# Patient Record
Sex: Female | Born: 1985 | Race: White | Hispanic: No | Marital: Single | Smoking: Current every day smoker
Health system: Southern US, Community
[De-identification: ages and names within clinical notes are randomized; demographics above are authoritative.]

## PROBLEM LIST (undated history)

## (undated) DIAGNOSIS — Z789 Other specified health status: Secondary | ICD-10-CM

## (undated) HISTORY — PX: APPENDECTOMY: SHX54

## (undated) HISTORY — DX: Other specified health status: Z78.9

---

## 2006-06-14 ENCOUNTER — Emergency Department: Payer: Self-pay | Admitting: Emergency Medicine

## 2007-07-15 ENCOUNTER — Emergency Department: Payer: Self-pay | Admitting: Emergency Medicine

## 2007-08-03 ENCOUNTER — Emergency Department: Payer: Self-pay | Admitting: Emergency Medicine

## 2008-01-31 ENCOUNTER — Encounter: Payer: Self-pay | Admitting: Obstetrics & Gynecology

## 2008-02-07 ENCOUNTER — Encounter: Payer: Self-pay | Admitting: Maternal and Fetal Medicine

## 2008-02-14 ENCOUNTER — Encounter: Payer: Self-pay | Admitting: Maternal & Fetal Medicine

## 2008-02-15 ENCOUNTER — Inpatient Hospital Stay: Payer: Self-pay

## 2009-05-20 IMAGING — US US FETAL BPP W/O NON-STRESS - NRPT
1 series · 14 of 18 positions shown · non-contrast
Comparison: none

[Series 1: us fetal bpp w/o non-stress - nrpt · 0.30mm/px · 14 of 18 slices shown]
[im 1/18]
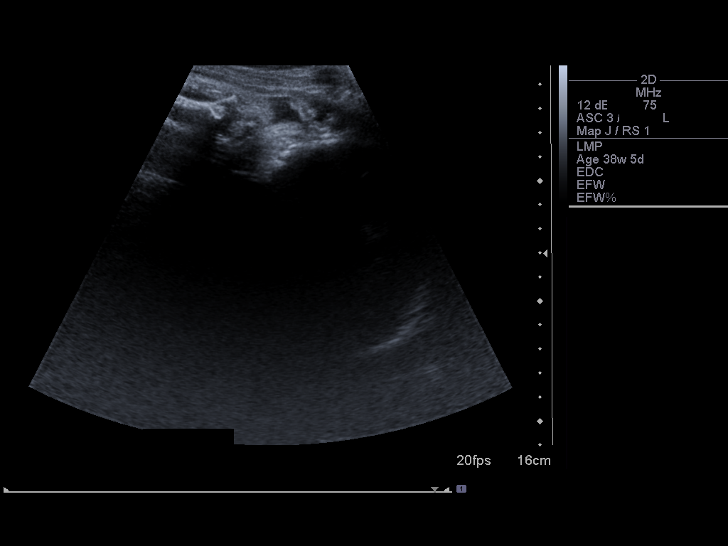
[im 2/18]
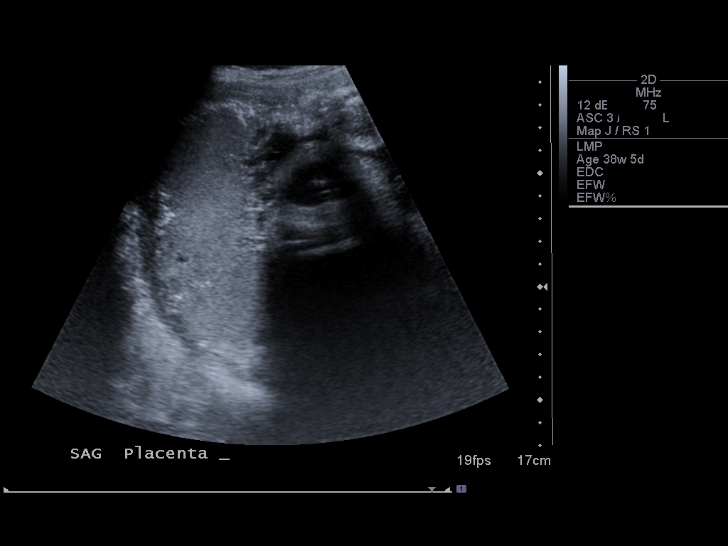
[im 4/18]
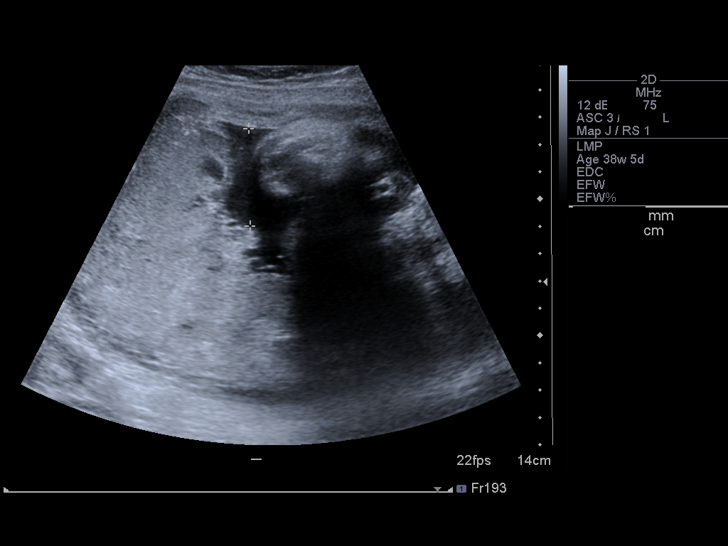
[im 5/18]
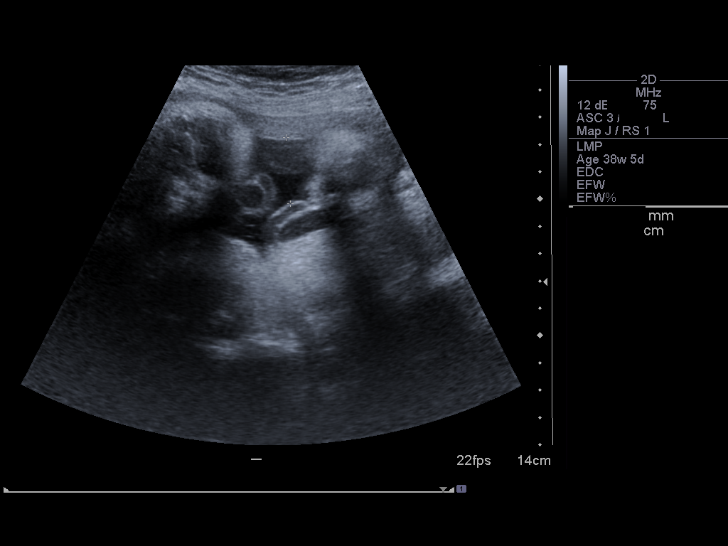
[im 6/18]
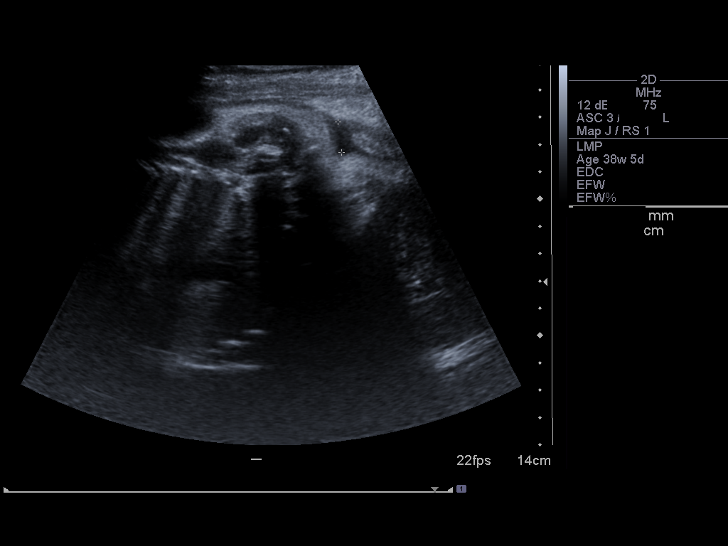
[im 8/18]
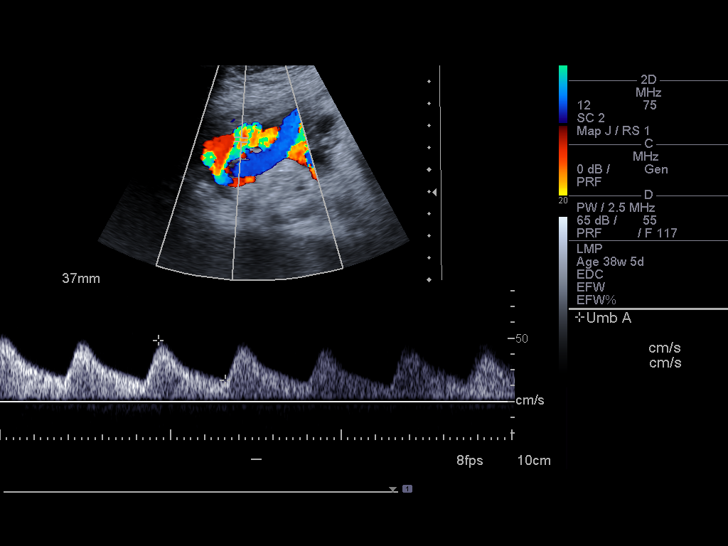
[im 9/18]
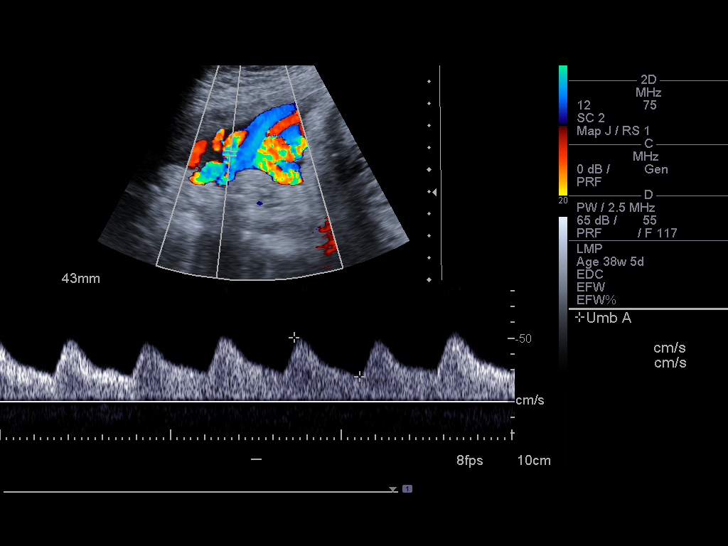
[im 10/18]
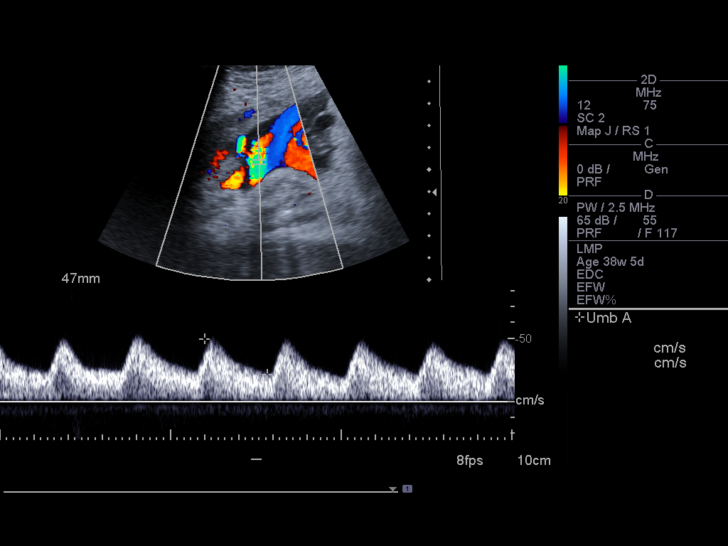
[im 11/18]
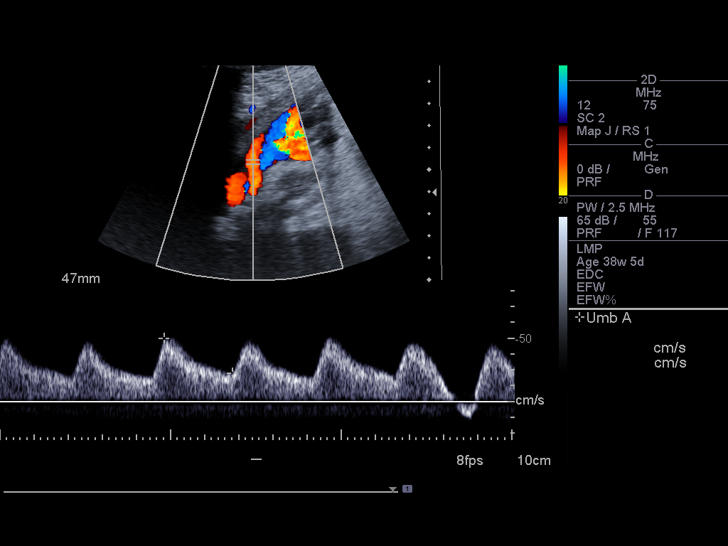
[im 13/18]
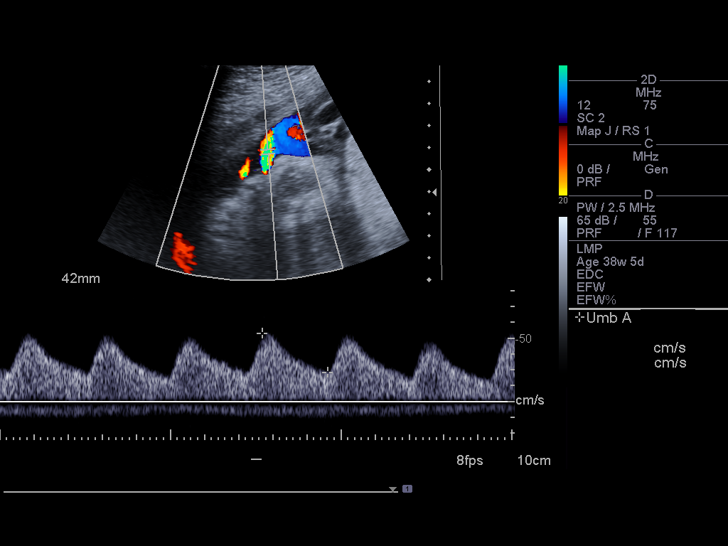
[im 14/18]
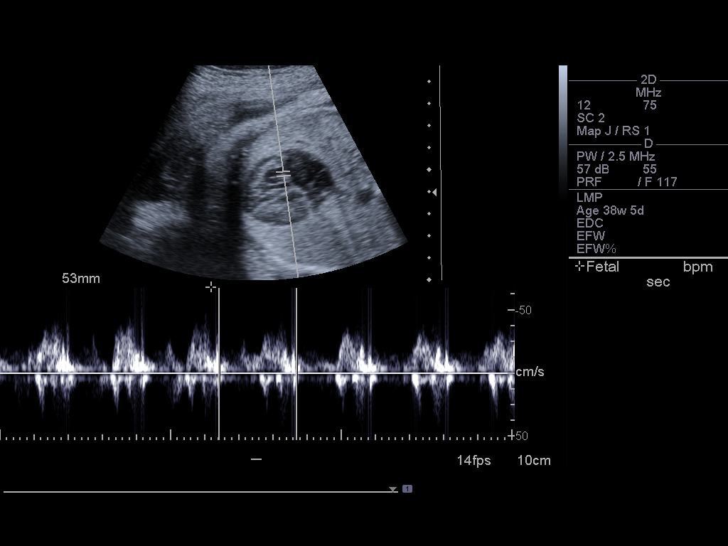
[im 15/18]
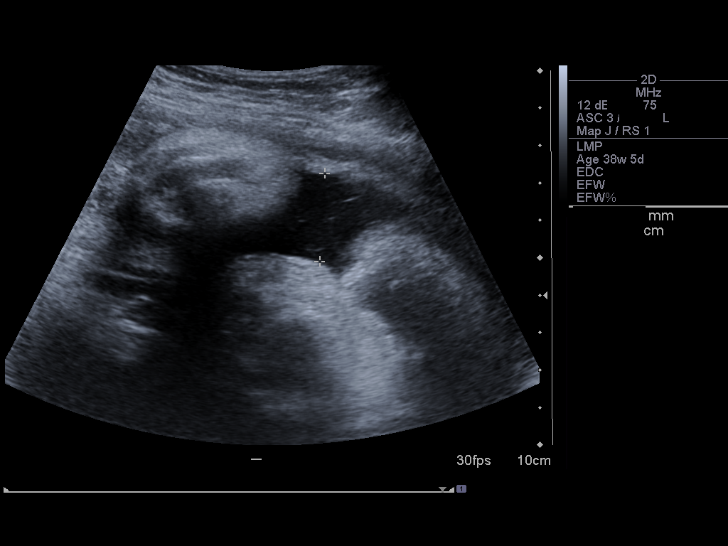
[im 17/18]
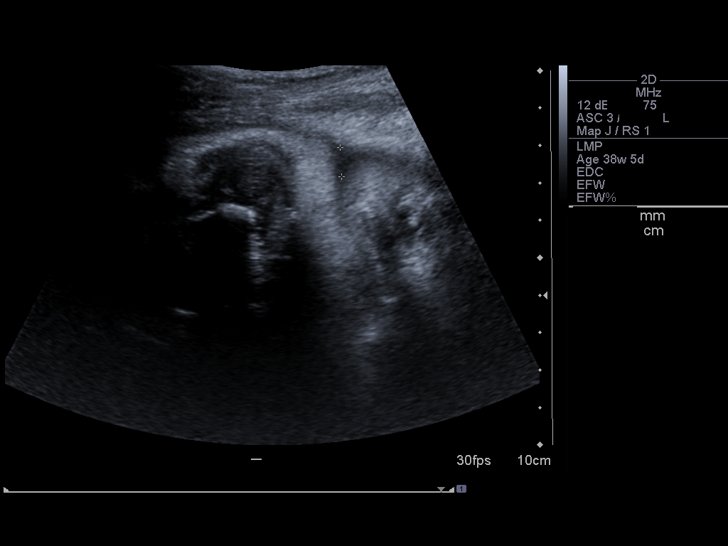
[im 18/18]
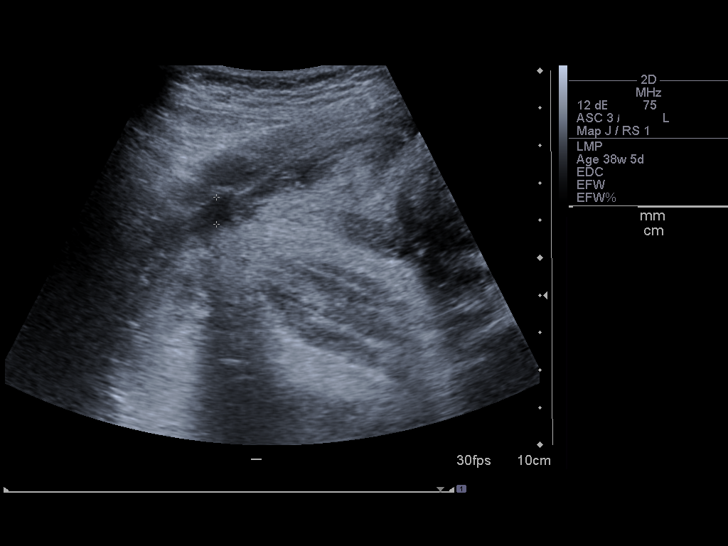

[14 of 18 positions shown; findings below may reference images not displayed]

IMAGES IMPORTED FROM THE SYNGO WORKFLOW SYSTEM
NO DICTATION FOR STUDY

## 2009-11-23 ENCOUNTER — Emergency Department: Payer: Self-pay | Admitting: Emergency Medicine

## 2009-12-31 ENCOUNTER — Ambulatory Visit: Payer: Self-pay | Admitting: Internal Medicine

## 2011-02-07 ENCOUNTER — Ambulatory Visit: Payer: Self-pay | Admitting: Internal Medicine

## 2011-04-01 ENCOUNTER — Ambulatory Visit: Payer: Self-pay
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
## 2015-03-11 ENCOUNTER — Emergency Department: Payer: No Typology Code available for payment source

## 2015-03-11 ENCOUNTER — Emergency Department
Admission: EM | Admit: 2015-03-11 | Discharge: 2015-03-11 | Disposition: A | Discharge status: Home / Self Care | Payer: No Typology Code available for payment source | Attending: Emergency Medicine | Admitting: Emergency Medicine

## 2015-03-11 ENCOUNTER — Encounter: Payer: Self-pay | Admitting: Emergency Medicine

## 2015-03-11 DIAGNOSIS — S4992XA Unspecified injury of left shoulder and upper arm, initial encounter: Secondary | ICD-10-CM | POA: Insufficient documentation

## 2015-03-11 DIAGNOSIS — Y92410 Unspecified street and highway as the place of occurrence of the external cause: Secondary | ICD-10-CM | POA: Diagnosis not present

## 2015-03-11 DIAGNOSIS — Y9389 Activity, other specified: Secondary | ICD-10-CM | POA: Insufficient documentation

## 2015-03-11 DIAGNOSIS — Z720 Tobacco use: Secondary | ICD-10-CM | POA: Diagnosis not present

## 2015-03-11 DIAGNOSIS — S20219A Contusion of unspecified front wall of thorax, initial encounter: Secondary | ICD-10-CM | POA: Diagnosis not present

## 2015-03-11 DIAGNOSIS — S4991XA Unspecified injury of right shoulder and upper arm, initial encounter: Secondary | ICD-10-CM | POA: Diagnosis not present

## 2015-03-11 DIAGNOSIS — Y998 Other external cause status: Secondary | ICD-10-CM | POA: Diagnosis not present

## 2015-03-11 DIAGNOSIS — S29001A Unspecified injury of muscle and tendon of front wall of thorax, initial encounter: Secondary | ICD-10-CM | POA: Diagnosis present

## 2015-03-11 MED ORDER — DIAZEPAM 5 MG PO TABS
5.0000 mg | ORAL_TABLET | Freq: Once | ORAL | Status: AC
  Administered 2015-03-11: 5 mg via ORAL

## 2015-03-11 MED ORDER — IBUPROFEN 600 MG PO TABS: 600.0000 mg | ORAL_TABLET | Freq: Three times a day (TID) | ORAL | Status: DC | PRN

## 2015-03-11 MED ORDER — DIAZEPAM 5 MG PO TABS
ORAL_TABLET | ORAL | Status: AC
  Administered 2015-03-11: 5 mg via ORAL
  Filled 2015-03-11: qty 1

## 2015-03-11 MED ORDER — DIAZEPAM 5 MG PO TABS: 5.0000 mg | ORAL_TABLET | Freq: Three times a day (TID) | ORAL | Status: DC | PRN
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
## 2015-10-09 LAB — HM PAP SMEAR: HM Pap smear: POSITIVE

## 2016-01-06 DIAGNOSIS — N879 Dysplasia of cervix uteri, unspecified: Secondary | ICD-10-CM | POA: Insufficient documentation

## 2016-05-14 ENCOUNTER — Emergency Department: Payer: Self-pay

## 2016-05-14 ENCOUNTER — Emergency Department
Admission: EM | Admit: 2016-05-14 | Discharge: 2016-05-14 | Disposition: A | Discharge status: Home / Self Care | Payer: Self-pay | Attending: Emergency Medicine | Admitting: Emergency Medicine

## 2016-05-14 ENCOUNTER — Encounter: Payer: Self-pay | Admitting: Emergency Medicine

## 2016-05-14 DIAGNOSIS — N939 Abnormal uterine and vaginal bleeding, unspecified: Secondary | ICD-10-CM

## 2016-05-14 DIAGNOSIS — F17210 Nicotine dependence, cigarettes, uncomplicated: Secondary | ICD-10-CM | POA: Insufficient documentation

## 2016-05-14 LAB — CBC WITH DIFFERENTIAL/PLATELET
BASOS ABS: 0 10*3/uL (ref 0–0.1)
BASOS PCT: 1 %
BASOS PCT: 0 %
Basophils Absolute: 0 10*3/uL (ref 0–0.1)
EOS ABS: 0.1 10*3/uL (ref 0–0.7)
Eosinophils Absolute: 0 10*3/uL (ref 0–0.7)
Eosinophils Relative: 1 %
Eosinophils Relative: 0 %
HCT: 41.6 % (ref 35.0–47.0)
HEMATOCRIT: 35.5 % (ref 35.0–47.0)
HEMOGLOBIN: 14.6 g/dL (ref 12.0–16.0)
HEMOGLOBIN: 12.4 g/dL (ref 12.0–16.0)
Lymphocytes Relative: 38 %
Lymphocytes Relative: 18 %
Lymphs Abs: 2.3 10*3/uL (ref 1.0–3.6)
Lymphs Abs: 1.4 10*3/uL (ref 1.0–3.6)
MCH: 32.5 pg (ref 26.0–34.0)
MCH: 33 pg (ref 26.0–34.0)
MCHC: 35.1 g/dL (ref 32.0–36.0)
MCHC: 35.1 g/dL (ref 32.0–36.0)
MCV: 92.7 fL (ref 80.0–100.0)
MCV: 94 fL (ref 80.0–100.0)
MONOS PCT: 5 %
Monocytes Absolute: 0.3 10*3/uL (ref 0.2–0.9)
Monocytes Absolute: 0.4 10*3/uL (ref 0.2–0.9)
Monocytes Relative: 6 %
NEUTROS ABS: 5.7 10*3/uL (ref 1.4–6.5)
NEUTROS PCT: 55 %
NEUTROS PCT: 76 %
Neutro Abs: 3.3 10*3/uL (ref 1.4–6.5)
Platelets: 219 10*3/uL (ref 150–440)
Platelets: 168 10*3/uL (ref 150–440)
RBC: 4.49 MIL/uL (ref 3.80–5.20)
RBC: 3.77 MIL/uL — ABNORMAL LOW (ref 3.80–5.20)
RDW: 13.1 % (ref 11.5–14.5)
RDW: 12.9 % (ref 11.5–14.5)
WBC: 6 10*3/uL (ref 3.6–11.0)
WBC: 7.5 10*3/uL (ref 3.6–11.0)

## 2016-05-14 LAB — COMPREHENSIVE METABOLIC PANEL
ALBUMIN: 4.7 g/dL (ref 3.5–5.0)
ALK PHOS: 58 U/L (ref 38–126)
ALT: 38 U/L (ref 14–54)
AST: 41 U/L (ref 15–41)
Anion gap: 13 (ref 5–15)
BUN: 8 mg/dL (ref 6–20)
CO2: 21 mmol/L — AB (ref 22–32)
Calcium: 9.5 mg/dL (ref 8.9–10.3)
Chloride: 105 mmol/L (ref 101–111)
Creatinine, Ser: 0.77 mg/dL (ref 0.44–1.00)
GFR calc Af Amer: >60 mL/min (ref >60)
GFR calc non Af Amer: >60 mL/min (ref >60)
GLUCOSE: 106 mg/dL — AB (ref 65–99)
POTASSIUM: 3.8 mmol/L (ref 3.5–5.1)
SODIUM: 139 mmol/L (ref 135–145)
Total Bilirubin: 1.4 mg/dL — ABNORMAL HIGH (ref 0.3–1.2)
Total Protein: 8.1 g/dL (ref 6.5–8.1)

## 2016-05-14 LAB — TYPE AND SCREEN
ABO/RH(D): O POS
Antibody Screen: NEGATIVE

## 2016-05-14 LAB — POCT PREGNANCY, URINE: PREG TEST UR: NEGATIVE

## 2016-05-14 LAB — CHLAMYDIA/NGC RT PCR (ARMC ONLY)

## 2016-05-14 MED ORDER — LORAZEPAM 2 MG/ML IJ SOLN
1.0000 mg | Freq: Once | INTRAMUSCULAR | Status: AC
  Administered 2016-05-14: 1 mg via INTRAVENOUS
  Filled 2016-05-14: qty 1

## 2016-05-14 MED ORDER — OXYCODONE-ACETAMINOPHEN 5-325 MG PO TABS: 2.0000 | ORAL_TABLET | Freq: Four times a day (QID) | ORAL | 0 refills | Status: DC | PRN

## 2016-05-14 MED ORDER — OXYCODONE-ACETAMINOPHEN 5-325 MG PO TABS
2.0000 | ORAL_TABLET | Freq: Once | ORAL | Status: AC
Start: 2016-05-14 — End: 2016-05-14
  Administered 2016-05-14: 2 via ORAL
  Filled 2016-05-14: qty 2

## 2016-05-14 MED ORDER — SODIUM CHLORIDE 0.9 % IV SOLN
Freq: Once | INTRAVENOUS | Status: AC
  Administered 2016-05-14: 11:00:00 via INTRAVENOUS

## 2016-05-14 NOTE — ED Notes (Signed)
Lab called back, unable to run gonorrhea and chlamiydia specimen due to the sample having too much blood

## 2016-05-14 NOTE — ED Notes (Signed)
Pt ambulatory in room. No bleeding at this time. Pt reports feeling a little dizzy per pt. steady gate. Jimmye Norman, MD informed.

## 2016-05-14 NOTE — ED Provider Notes (Signed)
[facility] Emergency Department Provider Note        Time seen: ----------------------------------------- 10:33 AM on 05/14/2016 -----------------------------------------    I have reviewed the triage vital signs and the nursing notes.   HISTORY  Chief Complaint Vaginal Bleeding    HPI Haley Thomas is a 30 y.o. female who presents to the ER for vaginal hemorrhage that started after having sex this morning. Patient states she's never had heavy bleeding before, nothing makes it better or worse. Patient placed a tampon to try to improve the bleeding. She denies anticoagulants, does not feel like there was an injury during intercourse.   History reviewed. No pertinent past medical history.  There are no active problems to display for this patient.   History reviewed. No pertinent surgical history.  Allergies Patient has no known allergies.  Social History Social History  Substance Use Topics  . Smoking status: Current Every Day Smoker    Packs/day: 0.50    Types: Cigarettes  . Smokeless tobacco: Never Used  . Alcohol use Yes     Comment: occassional     Review of Systems Constitutional: Negative for fever. Cardiovascular: Negative for chest pain. Respiratory: Negative for shortness of breath. Gastrointestinal: Negative for abdominal pain, vomiting and diarrhea. Genitourinary: Positive for heavy vaginal bleeding Musculoskeletal: Negative for back pain. Skin: Negative for rash. Neurological: Negative for headaches, focal weakness or numbness.  10-point ROS otherwise negative.  ____________________________________________   PHYSICAL EXAM:  VITAL SIGNS: ED Triage Vitals  Enc Vitals Group     BP      Pulse      Resp      Temp      Temp src      SpO2      Weight      Height      Head Circumference      Peak Flow      Pain Score      Pain Loc      Pain Edu?      Excl. in [place]?     Constitutional: Alert and oriented.  Anxious, mild distress Eyes: Conjunctivae are normal. PERRL. Normal extraocular movements. ENT   Head: Normocephalic and atraumatic.   Nose: No congestion/rhinnorhea.   Mouth/Throat: Mucous membranes are moist.   Neck: No stridor. Cardiovascular: Normal rate, regular rhythm. No murmurs, rubs, or gallops. Respiratory: Normal respiratory effort without tachypnea nor retractions. Breath sounds are clear and equal bilaterally. No wheezes/rales/rhonchi. Gastrointestinal: Soft and nontender. Normal bowel sounds Genitourinary: Bleeding seems under control at this time Musculoskeletal: Nontender with normal range of motion in all extremities. No lower extremity tenderness nor edema. Neurologic:  Normal speech and language. No gross focal neurologic deficits are appreciated.  Skin:  Skin is warm, dry and intact. No rash noted. Psychiatric: Anxious mood and affect ____________________________________________  ED COURSE:  Pertinent labs & imaging results that were available during my care of the patient were reviewed by me and considered in my medical decision making (see chart for details). Clinical Course   Patient is in mild distress, we will check basic labs and reevaluate.  Procedures ____________________________________________   LABS (pertinent positives/negatives)  Labs Reviewed  COMPREHENSIVE METABOLIC PANEL - Abnormal; Notable for the following:       Result Value   CO2 21 (*)    Glucose, Bld 106 (*)    Total Bilirubin 1.4 (*)    All other components within normal limits  CBC WITH DIFFERENTIAL/PLATELET - Abnormal; Notable for  the following:    RBC 3.77 (*)    All other components within normal limits  CHLAMYDIA/NGC RT PCR ([facility] ONLY)  CBC WITH DIFFERENTIAL/PLATELET  POC URINE PREG, ED  POCT PREGNANCY, URINE  TYPE AND SCREEN    RADIOLOGY Pelvic ultrasound  IMPRESSION: No acute findings or explanation for the patient's symptoms. No endometrial  thickening.  ____________________________________________  FINAL ASSESSMENT AND PLAN  Abnormal Vaginal bleeding  Plan: Patient with labs and imaging as dictated above. Patient is in no distress, she has stood up and the bleeding seems resolved at this time. Her vital signs are stable, I discussed with Dr. Kenton Kingfisher who will see the patient as an outpatient in the office. She is agreeable to plan at this time.   Earleen Newport, MD   Note: This dictation was prepared with Dragon dictation. Any transcriptional errors that result from this process are unintentional    Earleen Newport, MD 05/14/16 1452

## 2016-05-14 NOTE — ED Notes (Signed)
Lab called to add on Chlamydia to urine in lab.
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
## 2016-05-16 ENCOUNTER — Telehealth: Payer: Self-pay | Admitting: Emergency Medicine

## 2016-05-16 NOTE — Telephone Encounter (Addendum)
Called patient to notify that gc/chlamydia test will not be done due to specimen rejection.  Left message asking her to call me.  Also notified sara at [facility] as patient was referred to dr Kenton Kingfisher   Patient called me back and says she is going to [facility] for follow up.  I explained that gc/chlamydia test was not done, and she will inform her doctor.

## 2016-05-23 DIAGNOSIS — F17200 Nicotine dependence, unspecified, uncomplicated: Secondary | ICD-10-CM | POA: Insufficient documentation

## 2017-09-12 LAB — HM HIV SCREENING LAB: HM HIV Screening: NEGATIVE

## 2018-05-21 IMAGING — US US TRANSVAGINAL NON-OB
1 series · 13 of 25 positions shown · non-contrast
Comparison: Obstetric ultrasound 02/14/2008 -no report

CLINICAL DATA: Pelvic pain and vaginal bleeding for 3 hours after
intercourse. LMP 05/12/2016.

EXAM:
TRANSABDOMINAL AND TRANSVAGINAL ULTRASOUND OF PELVIS
TECHNIQUE: Both transabdominal and transvaginal ultrasound examinations of the
pelvis were performed. Transabdominal technique was performed for
global imaging of the pelvis including uterus, ovaries, adnexal
regions, and pelvic cul-de-sac. It was necessary to proceed with
endovaginal exam following the transabdominal exam to visualize the
endometrium and right ovary to better advantage.

[Series 1: us transvaginal non-ob · 0.18mm/px · 13 of 88 slices shown]
[im 1/88]
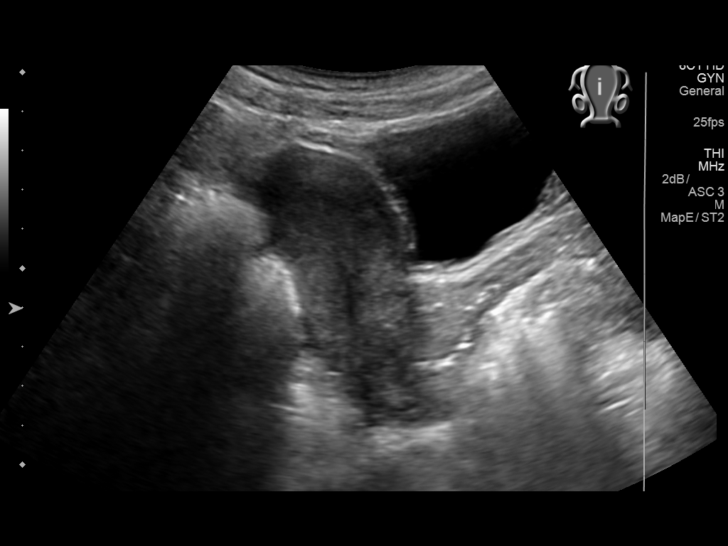
[im 8/88]
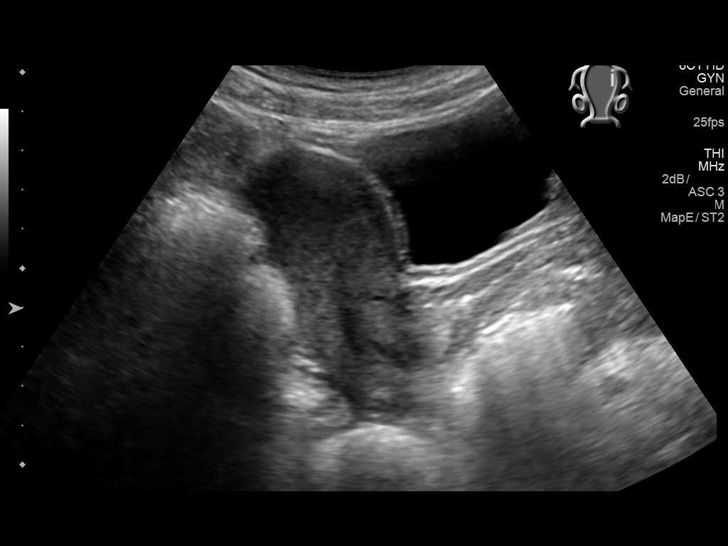
[im 15/88]
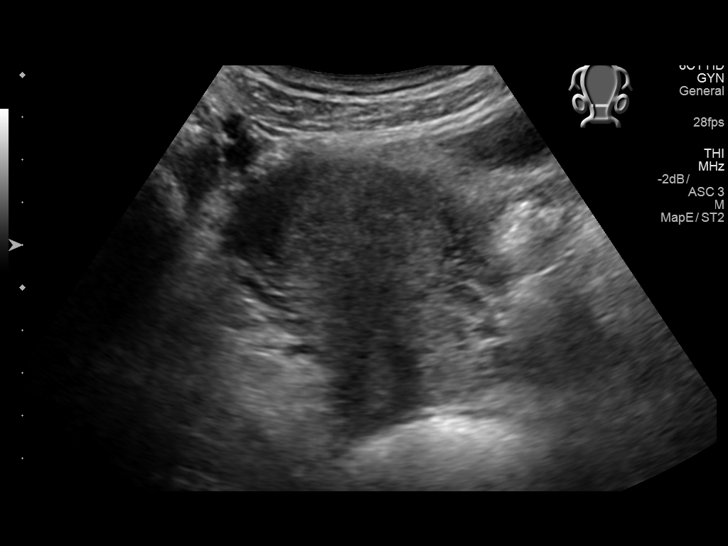
[im 22/88]
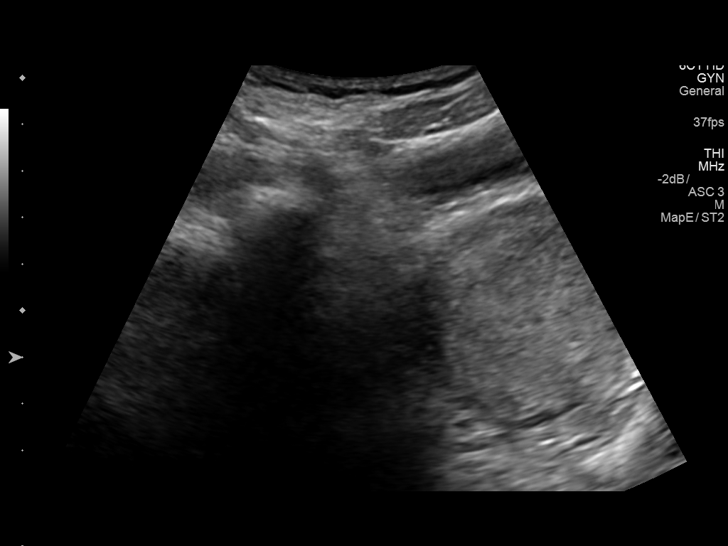
[im 30/88]
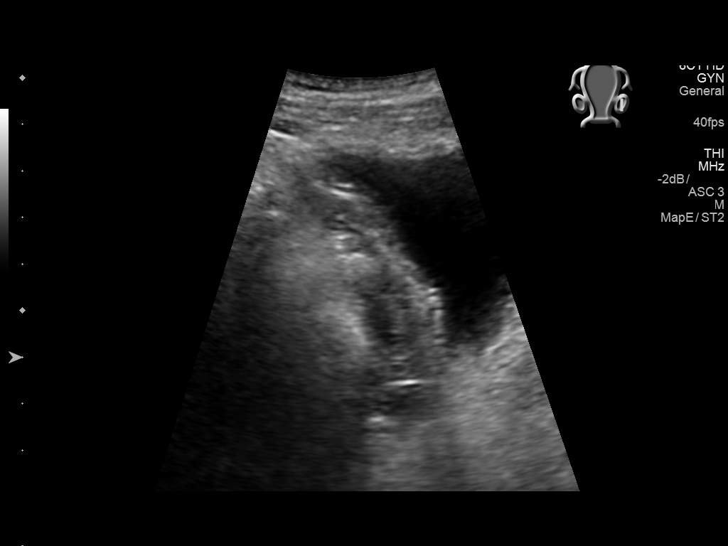
[im 37/88]
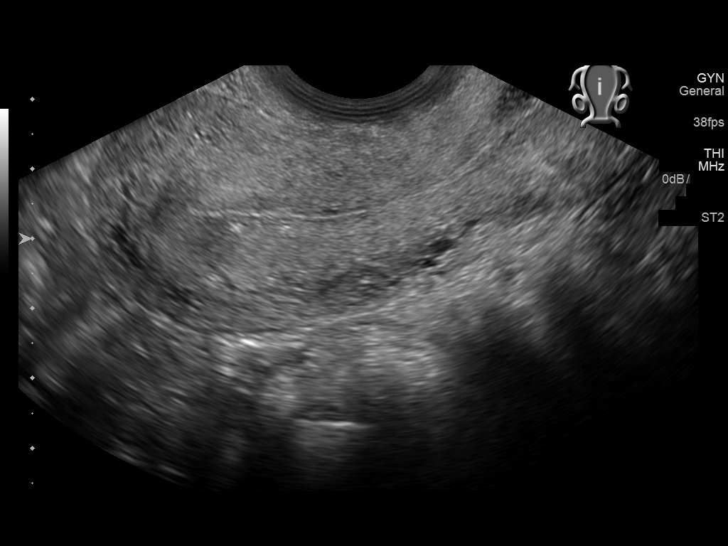
[im 44/88]
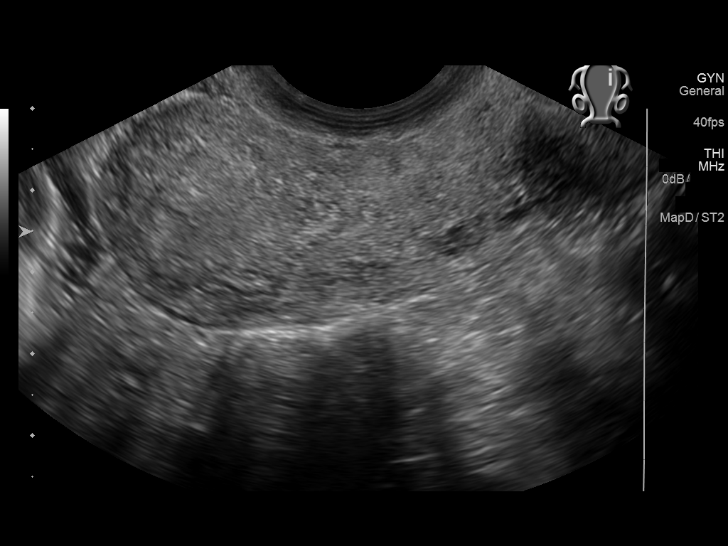
[im 51/88]
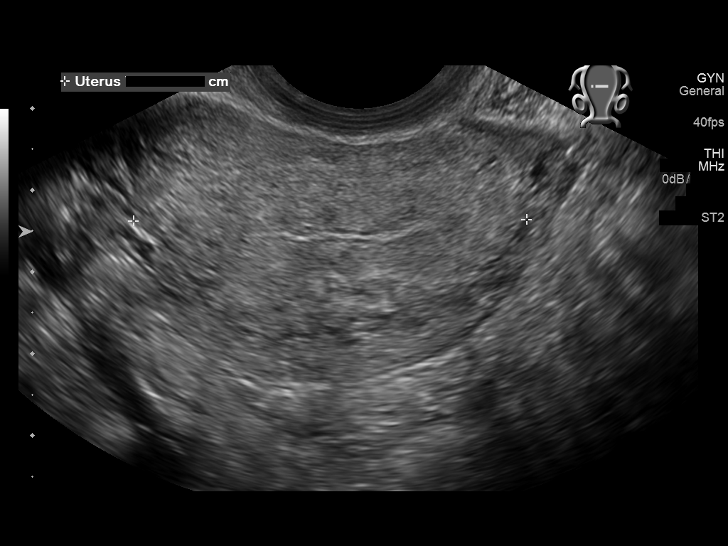
[im 59/88]
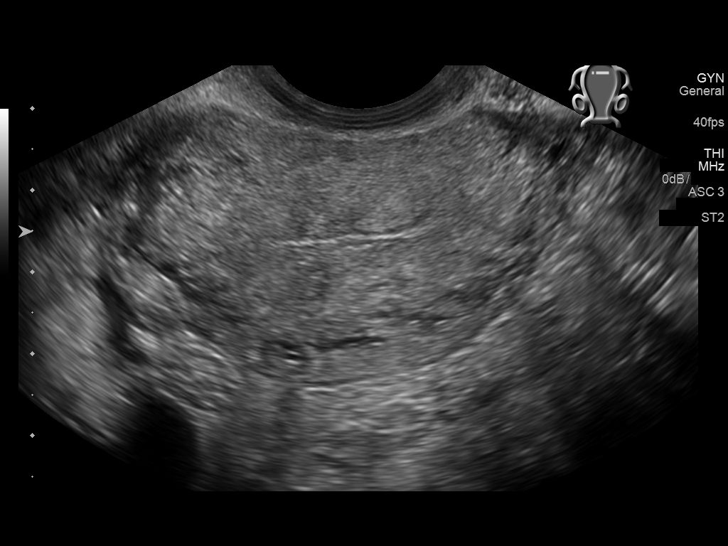
[im 66/88]
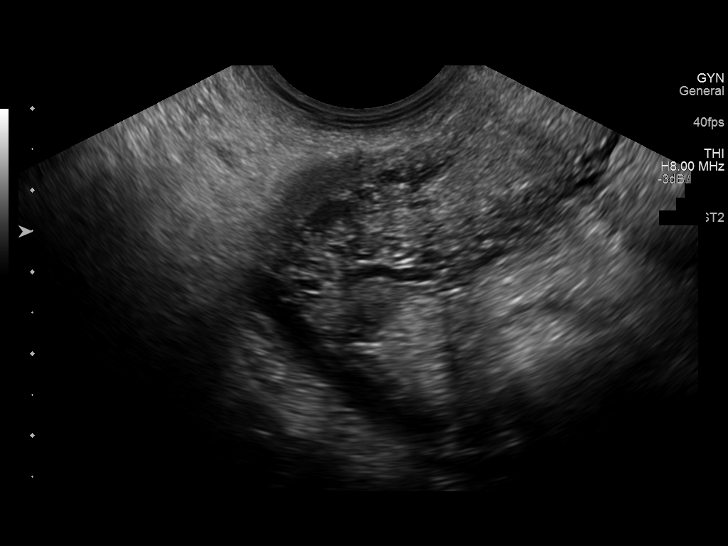
[im 73/88]
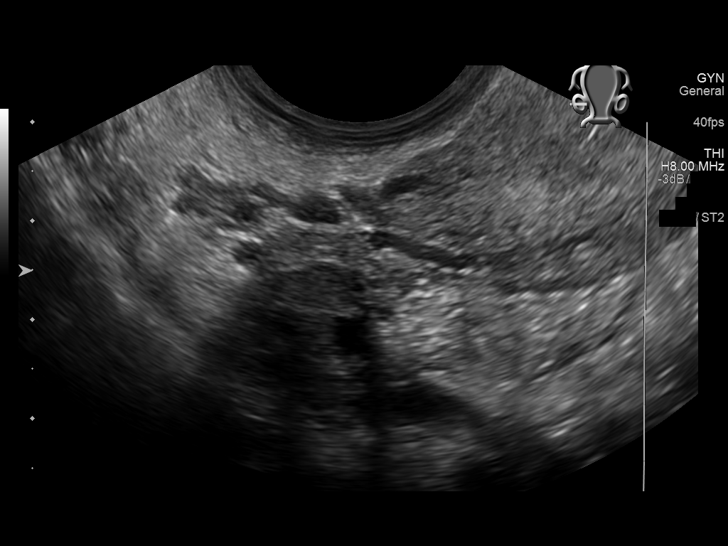
[im 80/88]
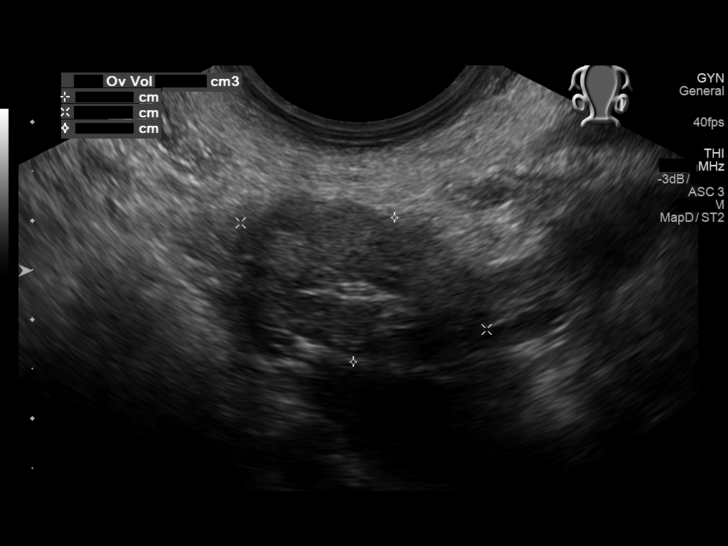
[im 88/88]
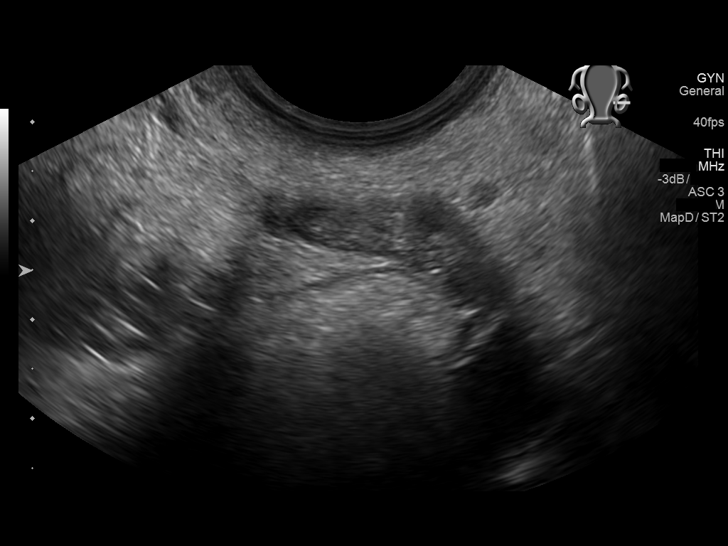

[13 of 25 positions shown; findings below may reference images not displayed]

FINDINGS: Uterus

Measurements: 7.2 x 3.4 x 4.8 cm. No fibroids or other mass
visualized.

Endometrium

Thickness: 1.4 mm. No focal abnormality identified. Possible small
sub endometrial calcifications.

Right ovary

Measurements: 2.4 x 1.4 x 1.3 cm. Normal appearance/no adnexal mass.
Normal blood flow with color Doppler.

Left ovary

Measurements: 2.7 x 1.5 x 1.9 cm. Normal appearance/no adnexal mass.
Normal blood flow with color Doppler.

Other findings

Trace free pelvic fluid.
IMPRESSION: No acute findings or explanation for the patient's symptoms. No
endometrial thickening.

## 2019-05-14 ENCOUNTER — Other Ambulatory Visit: Payer: Self-pay | Admitting: General Practice

## 2019-05-14 DIAGNOSIS — Z20822 Contact with and (suspected) exposure to covid-19: Secondary | ICD-10-CM

## 2019-05-16 LAB — NOVEL CORONAVIRUS, NAA: SARS-CoV-2, NAA: NOT DETECTED

## 2019-06-13 ENCOUNTER — Telehealth: Payer: Self-pay | Admitting: Family Medicine

## 2019-06-13 NOTE — Telephone Encounter (Signed)
Past PE 09/12/2017.   TC to patient re: BC refill.  Informed patient needs PE.Marland Kitchen Scheduled for 06/18/2019 Aileen Fass, RN

## 2019-06-13 NOTE — Telephone Encounter (Signed)
Patient wants her birth control refill prescription sent to her local pharmacy.

## 2019-06-18 ENCOUNTER — Ambulatory Visit: Payer: Self-pay

## 2019-06-25 ENCOUNTER — Other Ambulatory Visit: Payer: Self-pay

## 2019-06-25 ENCOUNTER — Other Ambulatory Visit: Payer: Self-pay | Admitting: Family Medicine

## 2019-06-25 ENCOUNTER — Ambulatory Visit (LOCAL_COMMUNITY_HEALTH_CENTER): Payer: 59 | Admitting: Family Medicine

## 2019-06-25 VITALS — BP 108/68 | Ht 66.5 in | Wt 107.0 lb

## 2019-06-25 DIAGNOSIS — Z30011 Encounter for initial prescription of contraceptive pills: Secondary | ICD-10-CM

## 2019-06-25 DIAGNOSIS — Z3009 Encounter for other general counseling and advice on contraception: Secondary | ICD-10-CM | POA: Diagnosis not present

## 2019-06-25 DIAGNOSIS — Z113 Encounter for screening for infections with a predominantly sexual mode of transmission: Secondary | ICD-10-CM | POA: Diagnosis not present

## 2019-06-25 MED ORDER — NORETHIN ACE-ETH ESTRAD-FE 1-20 MG-MCG PO TABS: 1.0000 | ORAL_TABLET | Freq: Every day | ORAL | 12 refills | Status: DC

## 2019-06-25 NOTE — Progress Notes (Signed)
Family Planning Visit- Repeat Yearly Visit  Subjective:  Haley Thomas is a 34 y.o. being seen today for an well woman visit and to discuss family planning options.    She is currently using condoms  for pregnancy prevention. Patient reports she does not if she or her partner wants a pregnancy in the next year. 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  Chief Complaint  Patient presents with  . Annual Exam    physical  . Contraception    desires OCP Rx sent to pharmacy    Patient reports h/o abnormal paps, last pap in 2019 was normal per client.  Client states that she wants to quit smoking.  She smokes 1/2ppd.  She tried the patches in the past.  Will decide at a later time when to quit.  Patient denies concerns or problems.  Does the patient desire a pregnancy in the next year? (OKQ flowsheet)no  See flowsheet for other program required questions.   Body mass index is 17.01 kg/m. - Patient is eligible for diabetes screening based on BMI and age [id number]?  not applicable [id number] ordered? not applicable  Patient reports 2 of partners in last year. Desires STI screening?  Yes  Does the patient have a current or past history of drug use? No    No components found for: HCV]   Health Maintenance Due  Topic Date Due  . TETANUS/TDAP  10/24/2005  . PAP SMEAR-Modifier  10/08/2016  . INFLUENZA VACCINE  01/12/2019    ROS  The following portions of the patient's history were reviewed and updated as appropriate: allergies, current medications, past family history, past medical history, past social history, past surgical history and problem list. Problem list updated.  Objective:   Vitals:   06/25/19 1611  BP: 108/68  Weight: 107 lb (48.5 kg)  Height: 5' 6.5" (1.689 m)    Physical Exam    Assessment and Plan:  Haley Thomas is a 34 y.o. female presenting to the [facility] Department for an initial well woman exam/family planning  visit  Contraception counseling: Reviewed all forms of birth control options in the tiered based approach. available including abstinence; over the counter/barrier methods; hormonal contraceptive medication including pill, patch, ring, injection,contraceptive implant; hormonal and nonhormonal IUDs; permanent sterilization options including vasectomy and the various tubal sterilization modalities. Risks, benefits, and typical effectiveness rates were reviewed.  Questions were answered.  She was told to call with any further questions, or with any concerns about this method of contraception.  Emphasized use of condoms 100% of the time for STI prevention.  Patient declines ECP. States no unprotected sex within the past 5 days. ECP  ECP counseling was not given - see RN documentation   Patient desires Lenda Kelp, this was prescribed for patient. She will follow up in  1 year  for surveillance.  Patient was counseled on the side effect of the method chosen, the correct use of her desired method and how to discontinue in the future. She was told to call with any further questions, or with any concerns about this method of contraception.  Emphasized use of condoms 100% of the time for STI prevention.  1. General counseling and advice on contraceptive management 5 As reviewed, though client states not ready at this time to quit smoking  2. OCP (oral contraceptive pills) initiation  - norethindrone-ethinyl estradiol (JUNEL FE 1/20) 1-20 MG-MCG tablet; Take 1 tablet by mouth daily.  Dispense: 1 Package; Refill: 12  Condoms  For back up x 1 week , always for STD prevention. 3. Screening examination for venereal disease  - Chlamydia/Gonorrhea [facility]     No follow-ups on file.  No future appointments.  Hassell Done, FNP

## 2019-06-25 NOTE — Progress Notes (Signed)
Pt desires to restart Junel Fe, did not have any problems with it.

## 2019-07-03 LAB — IGP, APTIMA HPV
HPV Aptima: NEGATIVE
PAP Smear Comment: 0

## 2020-06-10 ENCOUNTER — Other Ambulatory Visit: Payer: Self-pay | Admitting: Family Medicine

## 2020-06-10 DIAGNOSIS — Z30011 Encounter for initial prescription of contraceptive pills: Secondary | ICD-10-CM

## 2020-06-11 NOTE — Telephone Encounter (Signed)
Per chart review, patient had PE 06/2019 and refills sent to pharmacy for 3 month supply with refills for 1 year.  Will OK one refill and note to pharmacy that patient needs in-person visit prior to further refills.

## 2020-06-23 ENCOUNTER — Telehealth: Payer: Self-pay

## 2020-06-23 NOTE — Telephone Encounter (Signed)
Telephone call to patient today regarding the need for a physical and repeat PAP due 06/2020.  Patient is transferring her care to her new PCP.   Close to PAP f/u. Dahlia Bailiff, RN

## 2022-02-01 NOTE — Progress Notes (Unsigned)
   There were no vitals taken for this visit.   Subjective:    Patient ID: Haley Thomas, female    DOB: 1986-04-04, 36 y.o.   MRN: [id number]  HPI: Haley Thomas is a 36 y.o. female  No chief complaint on file.  Patient presents to clinic to establish care with new PCP.  Introduced to Designer, jewellery role and practice setting.  All questions answered.  Discussed provider/patient relationship and expectations.  Patient reports a history of ***. Patient denies a history of: Hypertension, Elevated Cholesterol, Diabetes, Thyroid problems, Depression, Anxiety, Neurological problems, and Abdominal problems.   Active Ambulatory Problems    Diagnosis Date Noted   Cervical dysplasia 01/06/2016   Resolved Ambulatory Problems    Diagnosis Date Noted   No Resolved Ambulatory Problems   Past Medical History:  Diagnosis Date   Patient denies medical problems    Past Surgical History:  Procedure Laterality Date   APPENDECTOMY      Family History  Problem Relation Age of Onset   Heart attack Mother    Hypertension Mother    Lung disease Mother    COPD Mother    Hypertension Father      Review of Systems  Per HPI unless specifically indicated above     Objective:    There were no vitals taken for this visit.  Wt Readings from Last 3 Encounters:  06/25/19 107 lb (48.5 kg)  09/12/17 110 lb (49.9 kg)  05/14/16 110 lb (49.9 kg)    Physical Exam  Results for orders placed or performed in visit on 06/25/19  IGP, Aptima HPV  Result Value Ref Range   DIAGNOSIS: Comment    Specimen adequacy: Comment    Clinician Provided ICD10 Comment    Performed by: Comment    PAP Smear Comment .    Note: Comment    Test Methodology Comment    HPV Aptima Negative Negative      Assessment & Plan:   Problem List Items Addressed This Visit   None Visit Diagnoses     Encounter to establish care    -  Primary        Follow up plan: No follow-ups on file.

## 2022-02-02 ENCOUNTER — Ambulatory Visit: Payer: 59 | Admitting: Nurse Practitioner

## 2022-02-02 ENCOUNTER — Encounter: Payer: Self-pay | Admitting: Nurse Practitioner

## 2022-02-02 VITALS — BP 104/69 | HR 85 | Temp 98.2°F | Ht 66.14 in | Wt 103.0 lb

## 2022-02-02 DIAGNOSIS — Z1331 Encounter for screening for depression: Secondary | ICD-10-CM | POA: Diagnosis not present

## 2022-02-02 DIAGNOSIS — D229 Melanocytic nevi, unspecified: Secondary | ICD-10-CM | POA: Diagnosis not present

## 2022-02-02 DIAGNOSIS — Z7689 Persons encountering health services in other specified circumstances: Secondary | ICD-10-CM | POA: Diagnosis not present

## 2022-02-02 DIAGNOSIS — F17200 Nicotine dependence, unspecified, uncomplicated: Secondary | ICD-10-CM

## 2022-02-02 DIAGNOSIS — R5383 Other fatigue: Secondary | ICD-10-CM

## 2022-02-02 NOTE — Assessment & Plan Note (Signed)
Current everyday smoker.  Smoking 1ppd.  Declines pneumonia shot.

## 2022-02-02 NOTE — Assessment & Plan Note (Signed)
Has been on Prozac in the past.  Suspect underlying thyroid issue.  Labs ordered today.  Will await labs and treat patient based on lab results.  If no thyroid issues will discuss medications for depression/anxiety at next visit.  Follow up in 1 month for reevaluation.  Call sooner if concerns arise.

## 2022-02-03 LAB — THYROID PEROXIDASE ANTIBODY: Thyroperoxidase Ab SerPl-aCnc: <9 IU/mL (ref 0–34)

## 2022-02-03 LAB — THYROID PANEL WITH TSH
Free Thyroxine Index: 1.2 (ref 1.2–4.9)
T3 Uptake Ratio: 22 % — ABNORMAL LOW (ref 24–39)
T4, Total: 5.4 ug/dL (ref 4.5–12.0)
TSH: 3.07 u[IU]/mL (ref 0.450–4.500)

## 2022-02-03 NOTE — Progress Notes (Signed)
Hi Haley Thomas.  Your thyroid labs are unremarkable.  We will continue to find the cause of your symptoms at your next visit.  Please let me know if you have any questions.

## 2022-03-10 NOTE — Progress Notes (Deleted)
There were no vitals taken for this visit.   Subjective:    Patient ID: Haley Thomas, female    DOB: Feb 07, 1986, 36 y.o.   MRN: [id number]  HPI: Haley Thomas is a 36 y.o. female presenting on 03/14/2022 for comprehensive medical examination. Current medical complaints include:{Blank single:19197::"none","***"}  She currently lives with: Menopausal Symptoms: {Blank single:19197::"yes","no"}  Depression Screen done today and results listed below:     02/02/2022    9:47 AM 06/25/2019    4:22 PM  Depression screen PHQ 2/9  Decreased Interest 0 0  Down, Depressed, Hopeless 1 0  PHQ - 2 Score 1 0  Altered sleeping 3   Tired, decreased energy 3   Change in appetite 2   Feeling bad or failure about yourself  0   Trouble concentrating 0   Moving slowly or fidgety/restless 0   Suicidal thoughts 0   PHQ-9 Score 9   Difficult doing work/chores Not difficult at all     The patient {has/does not have:19849} a history of falls. I {did/did not:19850} complete a risk assessment for falls. A plan of care for falls {was/was not:19852} documented.   Past Medical History:  Past Medical History:  Diagnosis Date  . Patient denies medical problems     Surgical History:  Past Surgical History:  Procedure Laterality Date  . APPENDECTOMY      Medications:  Current Outpatient Medications on File Prior to Visit  Medication Sig  . JUNEL FE 1/20 1-20 MG-MCG tablet Take 1 tablet by mouth daily.   No current facility-administered medications on file prior to visit.    Allergies:  No Known Allergies  Social History:  Social History   Socioeconomic History  . Marital status: Single    Spouse name: Not on file  . Number of children: Not on file  . Years of education: Not on file  . Highest education level: Not on file  Occupational History  . Not on file  Tobacco Use  . Smoking status: Every Day    Packs/day: 0.50    Types: Cigarettes  . Smokeless tobacco: Never  Vaping  Use  . Vaping Use: Never used  Substance and Sexual Activity  . Alcohol use: Yes    Comment: occassional   . Drug use: No  . Sexual activity: Yes    Birth control/protection: Condom  Other Topics Concern  . Not on file  Social History Narrative  . Not on file   Social Determinants of Health   Financial Resource Strain: Not on file  Food Insecurity: Not on file  Transportation Needs: Not on file  Physical Activity: Not on file  Stress: Not on file  Social Connections: Not on file  Intimate Partner Violence: Not on file   Social History   Tobacco Use  Smoking Status Every Day  . Packs/day: 0.50  . Types: Cigarettes  Smokeless Tobacco Never   Social History   Substance and Sexual Activity  Alcohol Use Yes   Comment: occassional     Family History:  Family History  Problem Relation Age of Onset  . Heart attack Mother   . Hypertension Mother   . Lung disease Mother   . COPD Mother   . Hypertension Father     Past medical history, surgical history, medications, allergies, family history and social history reviewed with patient today and changes made to appropriate areas of the chart.   ROS All other ROS negative except what is listed above and  in the HPI.      Objective:    There were no vitals taken for this visit.  Wt Readings from Last 3 Encounters:  02/02/22 103 lb (46.7 kg)  06/25/19 107 lb (48.5 kg)  09/12/17 110 lb (49.9 kg)    Physical Exam  Results for orders placed or performed in visit on 02/02/22  Thyroid Panel With TSH  Result Value Ref Range   TSH 3.070 0.450 - 4.500 uIU/mL   T4, Total 5.4 4.5 - 12.0 ug/dL   T3 Uptake Ratio 22 (L) 24 - 39 %   Free Thyroxine Index 1.2 1.2 - 4.9  Thyroid peroxidase antibody  Result Value Ref Range   Thyroperoxidase Ab SerPl-aCnc <9 0 - 34 IU/mL      Assessment & Plan:   Problem List Items Addressed This Visit       Other   Smoker   Other Visit Diagnoses     Annual physical exam    -  Primary    Screening for ischemic heart disease            Follow up plan: No follow-ups on file.   LABORATORY TESTING:  - Pap smear: {Blank YBOFBP:10258::"NID done","not applicable","up to date","done elsewhere"}  IMMUNIZATIONS:   - Tdap: Tetanus vaccination status reviewed: {tetanus status:315746}. - Influenza: {Blank single:19197::"Up to date","Administered today","Postponed to flu season","Refused","Given elsewhere"} - Pneumovax: {Blank single:19197::"Up to date","Administered today","Not applicable","Refused","Given elsewhere"} - Prevnar: {Blank single:19197::"Up to date","Administered today","Not applicable","Refused","Given elsewhere"} - COVID: {Blank single:19197::"Up to date","Administered today","Not applicable","Refused","Given elsewhere"} - HPV: {Blank single:19197::"Up to date","Administered today","Not applicable","Refused","Given elsewhere"} - Shingrix vaccine: {Blank single:19197::"Up to date","Administered today","Not applicable","Refused","Given elsewhere"}  SCREENING: -Mammogram: {Blank single:19197::"Up to date","Ordered today","Not applicable","Refused","Done elsewhere"}  - Colonoscopy: {Blank single:19197::"Up to date","Ordered today","Not applicable","Refused","Done elsewhere"}  - Bone Density: {Blank single:19197::"Up to date","Ordered today","Not applicable","Refused","Done elsewhere"}  -Hearing Test: {Blank single:19197::"Up to date","Ordered today","Not applicable","Refused","Done elsewhere"}  -Spirometry: {Blank single:19197::"Up to date","Ordered today","Not applicable","Refused","Done elsewhere"}   PATIENT COUNSELING:   Advised to take 1 mg of folate supplement per day if capable of pregnancy.   Sexuality: Discussed sexually transmitted diseases, partner selection, use of condoms, avoidance of unintended pregnancy  and contraceptive alternatives.   Advised to avoid cigarette smoking.  I discussed with the patient that most people either abstain from alcohol or  drink within safe limits (<=14/week and <=4 drinks/occasion for males, <=7/weeks and <= 3 drinks/occasion for females) and that the risk for alcohol disorders and other health effects rises proportionally with the number of drinks per week and how often a drinker exceeds daily limits.  Discussed cessation/primary prevention of drug use and availability of treatment for abuse.   Diet: Encouraged to adjust caloric intake to maintain  or achieve ideal body weight, to reduce intake of dietary saturated fat and total fat, to limit sodium intake by avoiding high sodium foods and not adding table salt, and to maintain adequate dietary potassium and calcium preferably from fresh fruits, vegetables, and low-fat dairy products.    stressed the importance of regular exercise  Injury prevention: Discussed safety belts, safety helmets, smoke detector, smoking near bedding or upholstery.   Dental health: Discussed importance of regular tooth brushing, flossing, and dental visits.    NEXT PREVENTATIVE PHYSICAL DUE IN 1 YEAR. No follow-ups on file.

## 2022-03-14 ENCOUNTER — Encounter: Payer: 59 | Admitting: Nurse Practitioner

## 2022-03-14 DIAGNOSIS — Z0000 Encounter for general adult medical examination without abnormal findings: Secondary | ICD-10-CM

## 2022-03-14 DIAGNOSIS — F17200 Nicotine dependence, unspecified, uncomplicated: Secondary | ICD-10-CM

## 2022-03-14 DIAGNOSIS — Z136 Encounter for screening for cardiovascular disorders: Secondary | ICD-10-CM

## 2022-04-04 NOTE — Progress Notes (Signed)
BP 106/74   Pulse 92   Temp 97.9 F (36.6 C) (Oral)   Ht 5' 6.54" (1.69 m)   Wt 104 lb 6.4 oz (47.4 kg)   LMP 03/30/2022 (Approximate)   SpO2 96%   BMI 16.58 kg/m    Subjective:    Patient ID: Haley Thomas, female    DOB: 06/12/1986, 36 y.o.   MRN: [id number]  HPI: Haley Thomas is a 36 y.o. female presenting on 04/05/2022 for comprehensive medical examination. Current medical complaints include: recently had melanoma removed  She currently lives with: Menopausal Symptoms: no   Denies HA, CP, SOB, dizziness, palpitations, visual changes, and lower extremity swelling.   MOOD Feels like she is doing well without medication.  Mood has been up and down.  Denies SI.   Depression Screen done today and results listed below:     04/05/2022    9:32 AM 02/02/2022    9:47 AM 06/25/2019    4:22 PM  Depression screen PHQ 2/9  Decreased Interest 0 0 0  Down, Depressed, Hopeless 0 1 0  PHQ - 2 Score 0 1 0  Altered sleeping 0 3   Tired, decreased energy 0 3   Change in appetite 0 2   Feeling bad or failure about yourself  0 0   Trouble concentrating 0 0   Moving slowly or fidgety/restless 0 0   Suicidal thoughts 0 0   PHQ-9 Score 0 9   Difficult doing work/chores Not difficult at all Not difficult at all     The patient does not have a history of falls. I did complete a risk assessment for falls. A plan of care for falls was documented.   Past Medical History:  Past Medical History:  Diagnosis Date   Patient denies medical problems     Surgical History:  Past Surgical History:  Procedure Laterality Date   APPENDECTOMY      Medications:  Current Outpatient Medications on File Prior to Visit  Medication Sig   JUNEL FE 1/20 1-20 MG-MCG tablet Take 1 tablet by mouth daily.   No current facility-administered medications on file prior to visit.    Allergies:  No Known Allergies  Social History:  Social History   Socioeconomic History   Marital status:  Single    Spouse name: Not on file   Number of children: Not on file   Years of education: Not on file   Highest education level: Not on file  Occupational History   Not on file  Tobacco Use   Smoking status: Every Day    Packs/day: 0.50    Types: Cigarettes   Smokeless tobacco: Never  Vaping Use   Vaping Use: Some days  Substance and Sexual Activity   Alcohol use: Yes    Comment: occassional    Drug use: No   Sexual activity: Yes    Birth control/protection: Condom, Pill  Other Topics Concern   Not on file  Social History Narrative   Not on file   Social Determinants of Health   Financial Resource Strain: Not on file  Food Insecurity: Not on file  Transportation Needs: Not on file  Physical Activity: Not on file  Stress: Not on file  Social Connections: Not on file  Intimate Partner Violence: Not on file   Social History   Tobacco Use  Smoking Status Every Day   Packs/day: 0.50   Types: Cigarettes  Smokeless Tobacco Never   Social History  Substance and Sexual Activity  Alcohol Use Yes   Comment: occassional     Family History:  Family History  Problem Relation Age of Onset   Heart attack Mother    Hypertension Mother    Lung disease Mother    COPD Mother    Hypertension Father     Past medical history, surgical history, medications, allergies, family history and social history reviewed with patient today and changes made to appropriate areas of the chart.   Review of Systems  Eyes:  Negative for blurred vision and double vision.  Respiratory:  Negative for shortness of breath.   Cardiovascular:  Negative for chest pain, palpitations and leg swelling.  Skin:        Stiches on chest from melanoma removal  Neurological:  Negative for dizziness and headaches.   All other ROS negative except what is listed above and in the HPI.      Objective:    BP 106/74   Pulse 92   Temp 97.9 F (36.6 C) (Oral)   Ht 5' 6.54" (1.69 m)   Wt 104 lb 6.4 oz  (47.4 kg)   LMP 03/30/2022 (Approximate)   SpO2 96%   BMI 16.58 kg/m   Wt Readings from Last 3 Encounters:  04/05/22 104 lb 6.4 oz (47.4 kg)  02/02/22 103 lb (46.7 kg)  06/25/19 107 lb (48.5 kg)    Physical Exam Vitals and nursing note reviewed. Exam conducted with a chaperone present [facility] [place], [place]).  Constitutional:      General: She is awake. She is not in acute distress.    Appearance: She is well-developed. She is not ill-appearing.  HENT:     Head: Normocephalic and atraumatic.     Right Ear: Hearing, tympanic membrane, ear canal and external ear normal. No drainage.     Left Ear: Hearing, tympanic membrane, ear canal and external ear normal. No drainage.     Nose: Nose normal.     Right Sinus: No maxillary sinus tenderness or frontal sinus tenderness.     Left Sinus: No maxillary sinus tenderness or frontal sinus tenderness.     Mouth/Throat:     Mouth: Mucous membranes are moist.     Pharynx: Oropharynx is clear. Uvula midline. No pharyngeal swelling, oropharyngeal exudate or posterior oropharyngeal erythema.  Eyes:     General: Lids are normal.        Right eye: No discharge.        Left eye: No discharge.     Extraocular Movements: Extraocular movements intact.     Conjunctiva/sclera: Conjunctivae normal.     Pupils: Pupils are equal, round, and reactive to light.     Visual Fields: Right eye visual fields normal and left eye visual fields normal.  Neck:     Thyroid: No thyromegaly.     Vascular: No carotid bruit.     Trachea: Trachea normal.  Cardiovascular:     Rate and Rhythm: Normal rate and regular rhythm.     Heart sounds: Normal heart sounds. No murmur heard.    No gallop.  Pulmonary:     Effort: Pulmonary effort is normal. No accessory muscle usage or respiratory distress.     Breath sounds: Normal breath sounds.  Chest:  Breasts:    Right: Normal.     Left: Normal.  Abdominal:     General: Bowel sounds are normal.     Palpations: Abdomen is  soft. There is no hepatomegaly or splenomegaly.  Tenderness: There is no abdominal tenderness.  Genitourinary:    General: Normal vulva.     Exam position: Knee-chest position.     Vagina: Normal.     Cervix: Normal.     Adnexa: Right adnexa normal and left adnexa normal.  Musculoskeletal:        General: Normal range of motion.     Cervical back: Normal range of motion and neck supple.     Right lower leg: No edema.     Left lower leg: No edema.  Lymphadenopathy:     Head:     Right side of head: No submental, submandibular, tonsillar, preauricular or posterior auricular adenopathy.     Left side of head: No submental, submandibular, tonsillar, preauricular or posterior auricular adenopathy.     Cervical: No cervical adenopathy.     Upper Body:     Right upper body: No supraclavicular, axillary or pectoral adenopathy.     Left upper body: No supraclavicular, axillary or pectoral adenopathy.  Skin:    General: Skin is warm and dry.     Capillary Refill: Capillary refill takes less than 2 seconds.     Findings: No rash.       Neurological:     Mental Status: She is alert and oriented to person, place, and time.     Gait: Gait is intact.     Deep Tendon Reflexes: Reflexes are normal and symmetric.     Reflex Scores:      Brachioradialis reflexes are 2+ on the right side and 2+ on the left side.      Patellar reflexes are 2+ on the right side and 2+ on the left side. Psychiatric:        Attention and Perception: Attention normal.        Mood and Affect: Mood normal.        Speech: Speech normal.        Behavior: Behavior normal. Behavior is cooperative.        Thought Content: Thought content normal.        Judgment: Judgment normal.     Results for orders placed or performed in visit on 02/02/22  Thyroid Panel With TSH  Result Value Ref Range   TSH 3.070 0.450 - 4.500 uIU/mL   T4, Total 5.4 4.5 - 12.0 ug/dL   T3 Uptake Ratio 22 (L) 24 - 39 %   Free Thyroxine Index  1.2 1.2 - 4.9  Thyroid peroxidase antibody  Result Value Ref Range   Thyroperoxidase Ab SerPl-aCnc <9 0 - 34 IU/mL      Assessment & Plan:   Problem List Items Addressed This Visit       Other   Smoker    Current everyday smoker.  Smoking 1ppd.  Declines pneumonia shot.      Positive depression screening    Chronic.  Controlled without medication.  Labs ordered today.  Return to clinic in 1 year for reevaluation.  Call sooner if concerns arise.        Other Visit Diagnoses     Annual physical exam    -  Primary   Health maintenance reviewed during visit today.  Labs ordered.  PAP done.  Declined vaccines.    Relevant Orders   CBC with Differential/Platelet   Comprehensive metabolic panel   Lipid panel   TSH   Urinalysis, Routine w reflex microscopic   Cytology - PAP   Screening for ischemic heart disease  Relevant Orders   Lipid panel   Encounter for hepatitis C screening test for low risk patient       Relevant Orders   Hepatitis C Antibody        Follow up plan: Return in about 1 year (around 04/06/2023) for Physical and Fasting labs.   LABORATORY TESTING:  - Pap smear: pap done  IMMUNIZATIONS:   - Tdap: Tetanus vaccination status reviewed: Refused. - Influenza: Refused - Pneumovax: Refused - Prevnar: Not applicable - COVID: Not applicable - HPV: Not applicable - Shingrix vaccine: Not applicable  SCREENING: -Mammogram: Not applicable  - Colonoscopy: Not applicable  - Bone Density: Not applicable  -Hearing Test: Not applicable  -Spirometry: Not applicable   PATIENT COUNSELING:   Advised to take 1 mg of folate supplement per day if capable of pregnancy.   Sexuality: Discussed sexually transmitted diseases, partner selection, use of condoms, avoidance of unintended pregnancy  and contraceptive alternatives.   Advised to avoid cigarette smoking.  I discussed with the patient that most people either abstain from alcohol or drink within safe  limits (<=14/week and <=4 drinks/occasion for males, <=7/weeks and <= 3 drinks/occasion for females) and that the risk for alcohol disorders and other health effects rises proportionally with the number of drinks per week and how often a drinker exceeds daily limits.  Discussed cessation/primary prevention of drug use and availability of treatment for abuse.   Diet: Encouraged to adjust caloric intake to maintain  or achieve ideal body weight, to reduce intake of dietary saturated fat and total fat, to limit sodium intake by avoiding high sodium foods and not adding table salt, and to maintain adequate dietary potassium and calcium preferably from fresh fruits, vegetables, and low-fat dairy products.    stressed the importance of regular exercise  Injury prevention: Discussed safety belts, safety helmets, smoke detector, smoking near bedding or upholstery.   Dental health: Discussed importance of regular tooth brushing, flossing, and dental visits.    NEXT PREVENTATIVE PHYSICAL DUE IN 1 YEAR. Return in about 1 year (around 04/06/2023) for Physical and Fasting labs.

## 2022-04-05 ENCOUNTER — Encounter: Payer: Self-pay | Admitting: Nurse Practitioner

## 2022-04-05 ENCOUNTER — Other Ambulatory Visit (HOSPITAL_COMMUNITY)
Admission: RE | Admit: 2022-04-05 | Discharge: 2022-04-05 | Disposition: A | Discharge status: Home / Self Care | Payer: Self-pay | Source: Ambulatory Visit | Attending: Nurse Practitioner | Admitting: Nurse Practitioner

## 2022-04-05 ENCOUNTER — Ambulatory Visit (INDEPENDENT_AMBULATORY_CARE_PROVIDER_SITE_OTHER): Payer: 59 | Admitting: Nurse Practitioner

## 2022-04-05 ENCOUNTER — Telehealth: Payer: Self-pay | Admitting: Nurse Practitioner

## 2022-04-05 VITALS — BP 106/74 | HR 92 | Temp 97.9°F | Ht 66.54 in | Wt 104.4 lb

## 2022-04-05 DIAGNOSIS — Z1159 Encounter for screening for other viral diseases: Secondary | ICD-10-CM | POA: Diagnosis not present

## 2022-04-05 DIAGNOSIS — Z0000 Encounter for general adult medical examination without abnormal findings: Secondary | ICD-10-CM | POA: Diagnosis not present

## 2022-04-05 DIAGNOSIS — Z136 Encounter for screening for cardiovascular disorders: Secondary | ICD-10-CM | POA: Diagnosis not present

## 2022-04-05 DIAGNOSIS — Z1331 Encounter for screening for depression: Secondary | ICD-10-CM

## 2022-04-05 DIAGNOSIS — F17200 Nicotine dependence, unspecified, uncomplicated: Secondary | ICD-10-CM

## 2022-04-05 LAB — URINALYSIS, ROUTINE W REFLEX MICROSCOPIC
Bilirubin, UA: NEGATIVE
Glucose, UA: NEGATIVE
Ketones, UA: NEGATIVE
Leukocytes,UA: NEGATIVE
Nitrite, UA: NEGATIVE
Specific Gravity, UA: 1.015 (ref 1.005–1.030)
Urobilinogen, Ur: 1 mg/dL (ref 0.2–1.0)
pH, UA: 7.5 (ref 5.0–7.5)

## 2022-04-05 LAB — MICROSCOPIC EXAMINATION: Bacteria, UA: NONE SEEN

## 2022-04-05 MED ORDER — JUNEL FE 1/20 1-20 MG-MCG PO TABS: 1.0000 | ORAL_TABLET | Freq: Every day | ORAL | 3 refills | Status: DC

## 2022-04-05 NOTE — Telephone Encounter (Signed)
Copied from [place] [id number]. Topic: General - Other >> Apr 05, 2022  2:13 PM [place] B wrote: Reason for CRM: Patient had appt today with Dr Mathis Dad and forgot to ask her about prescribing birth control. Please call back.

## 2022-04-05 NOTE — Assessment & Plan Note (Signed)
Current everyday smoker.  Smoking 1ppd.  Declines pneumonia shot.

## 2022-04-05 NOTE — Assessment & Plan Note (Signed)
Chronic.  Controlled without medication.  Labs ordered today.  Return to clinic in 1 year for reevaluation.  Call sooner if concerns arise.

## 2022-04-05 NOTE — Telephone Encounter (Signed)
Medication sent to the pharmacy.

## 2022-04-06 LAB — CBC WITH DIFFERENTIAL/PLATELET
Basophils Absolute: 0.1 10*3/uL (ref 0.0–0.2)
Basos: 1 %
EOS (ABSOLUTE): 0.1 10*3/uL (ref 0.0–0.4)
Eos: 2 %
Hematocrit: 44.5 % (ref 34.0–46.6)
Hemoglobin: 14.9 g/dL (ref 11.1–15.9)
Immature Grans (Abs): 0 10*3/uL (ref 0.0–0.1)
Immature Granulocytes: 0 %
Lymphocytes Absolute: 1.6 10*3/uL (ref 0.7–3.1)
Lymphs: 44 %
MCH: 36.6 pg — ABNORMAL HIGH (ref 26.6–33.0)
MCHC: 33.5 g/dL (ref 31.5–35.7)
MCV: 109 fL — ABNORMAL HIGH (ref 79–97)
Monocytes Absolute: 0.2 10*3/uL (ref 0.1–0.9)
Monocytes: 6 %
Neutrophils Absolute: 1.7 10*3/uL (ref 1.4–7.0)
Neutrophils: 47 %
Platelets: 283 10*3/uL (ref 150–450)
RBC: 4.07 x10E6/uL (ref 3.77–5.28)
RDW: 13.1 % (ref 11.7–15.4)
WBC: 3.6 10*3/uL (ref 3.4–10.8)

## 2022-04-06 LAB — COMPREHENSIVE METABOLIC PANEL
ALT: 16 IU/L (ref 0–32)
AST: 22 IU/L (ref 0–40)
Albumin/Globulin Ratio: 2 (ref 1.2–2.2)
Albumin: 4.8 g/dL (ref 3.9–4.9)
Alkaline Phosphatase: 85 IU/L (ref 44–121)
BUN/Creatinine Ratio: 8 — ABNORMAL LOW (ref 9–23)
BUN: 6 mg/dL (ref 6–20)
Bilirubin Total: 0.8 mg/dL (ref 0.0–1.2)
CO2: 21 mmol/L (ref 20–29)
Calcium: 9.7 mg/dL (ref 8.7–10.2)
Chloride: 101 mmol/L (ref 96–106)
Creatinine, Ser: 0.72 mg/dL (ref 0.57–1.00)
Globulin, Total: 2.4 g/dL (ref 1.5–4.5)
Glucose: 70 mg/dL (ref 70–99)
Potassium: 4 mmol/L (ref 3.5–5.2)
Sodium: 141 mmol/L (ref 134–144)
Total Protein: 7.2 g/dL (ref 6.0–8.5)
eGFR: 112 mL/min/{1.73_m2} (ref >59)

## 2022-04-06 LAB — LIPID PANEL
Chol/HDL Ratio: 3.2 ratio (ref 0.0–4.4)
Cholesterol, Total: 204 mg/dL — ABNORMAL HIGH (ref 100–199)
HDL: 64 mg/dL (ref >39)
LDL Chol Calc (NIH): 89 mg/dL (ref 0–99)
Triglycerides: 312 mg/dL — ABNORMAL HIGH (ref 0–149)
VLDL Cholesterol Cal: 51 mg/dL — ABNORMAL HIGH (ref 5–40)

## 2022-04-06 LAB — HEPATITIS C ANTIBODY: Hep C Virus Ab: NONREACTIVE

## 2022-04-06 LAB — TSH: TSH: 0.899 u[IU]/mL (ref 0.450–4.500)

## 2022-04-06 NOTE — Progress Notes (Signed)
HI Armelia. It was nice to see you yesterday.  Your lab work looks good.  Your cholesterol is elevated.  I recommend decreasing processed foods and refined sugar intake.  No other concerns at this time. Continue with your current medication regimen.  Follow up as discussed.  Please let me know if you have any questions.

## 2022-04-07 LAB — CYTOLOGY - PAP: Diagnosis: NEGATIVE

## 2022-04-07 NOTE — Progress Notes (Signed)
Contacted via MyChart   Good evening Chrissa, your pap returned normal -- repeat in one year.

## 2022-04-07 NOTE — Progress Notes (Signed)
Sorry, 3 years:)

## 2022-10-05 ENCOUNTER — Other Ambulatory Visit: Payer: Self-pay

## 2022-10-05 ENCOUNTER — Emergency Department
Admission: EM | Admit: 2022-10-05 | Discharge: 2022-10-05 | Disposition: A | Discharge status: Home / Self Care | Payer: Self-pay | Attending: Emergency Medicine | Admitting: Emergency Medicine

## 2022-10-05 ENCOUNTER — Encounter: Payer: Self-pay | Admitting: Emergency Medicine

## 2022-10-05 DIAGNOSIS — W57XXXA Bitten or stung by nonvenomous insect and other nonvenomous arthropods, initial encounter: Secondary | ICD-10-CM | POA: Insufficient documentation

## 2022-10-05 DIAGNOSIS — R519 Headache, unspecified: Secondary | ICD-10-CM | POA: Insufficient documentation

## 2022-10-05 DIAGNOSIS — R1110 Vomiting, unspecified: Secondary | ICD-10-CM | POA: Insufficient documentation

## 2022-10-05 DIAGNOSIS — S80261A Insect bite (nonvenomous), right knee, initial encounter: Secondary | ICD-10-CM | POA: Insufficient documentation

## 2022-10-05 DIAGNOSIS — M79601 Pain in right arm: Secondary | ICD-10-CM | POA: Insufficient documentation

## 2022-10-05 DIAGNOSIS — F17200 Nicotine dependence, unspecified, uncomplicated: Secondary | ICD-10-CM | POA: Insufficient documentation

## 2022-10-05 LAB — POC URINE PREG, ED: Preg Test, Ur: NEGATIVE

## 2022-10-05 MED ORDER — DOXYCYCLINE HYCLATE 100 MG PO TABS
200.0000 mg | ORAL_TABLET | Freq: Once | ORAL | Status: AC
  Administered 2022-10-05: 200 mg via ORAL
  Filled 2022-10-05: qty 2

## 2022-10-05 MED ORDER — IBUPROFEN 400 MG PO TABS
400.0000 mg | ORAL_TABLET | Freq: Once | ORAL | Status: AC
  Administered 2022-10-05: 400 mg via ORAL
  Filled 2022-10-05: qty 1

## 2022-10-05 NOTE — ED Triage Notes (Signed)
Patient to ED for tick bite. Patient states it was on right knee- pulled off last PM. C/o pain on right side of body and 1 episode of vomiting yesterday. Stating generalized tiredness.

## 2022-10-05 NOTE — ED Provider Notes (Signed)
[facility] Provider Note    Event Date/Time   First MD Initiated Contact with Patient 10/05/22 1401     (approximate)   History   Tick Removal   HPI  Haley Thomas is a 37 y.o. female who presents because of a tick on her right knee.  Patient noticed this yesterday and remove it herself.  She is not sure how long it was on.  She is not sure if the tick was engorged.  She brings the tick and a piece of the lumen and foil.  She says that after she took it off she burned it.  She is endorsing pain in the right arm leg and on the whole right side of her body no weakness.  Also had vomiting headache and is concerned about Lyme's.     Past Medical History:  Diagnosis Date   Patient denies medical problems     Patient Active Problem List   Diagnosis Date Noted   Positive depression screening 02/02/2022   Smoker 05/23/2016   Cervical dysplasia 01/06/2016     Physical Exam  Triage Vital Signs: ED Triage Vitals  Enc Vitals Group     BP 10/05/22 1354 96/75     Pulse Rate 10/05/22 1354 (!) 55     Resp 10/05/22 1354 18     Temp 10/05/22 1354 98.3 F (36.8 C)     Temp Source 10/05/22 1354 Oral     SpO2 10/05/22 1354 98 %     Weight --      Height --      Head Circumference --      Peak Flow --      Pain Score 10/05/22 1353 10     Pain Loc --      Pain Edu? --      Excl. in GC? --     Most recent vital signs: Vitals:   10/05/22 1354  BP: 96/75  Pulse: (!) 55  Resp: 18  Temp: 98.3 F (36.8 C)  SpO2: 98%     General: Awake, no distress.  CV:  Good peripheral perfusion.  Resp:  Normal effort.  Abd:  No distention.  Neuro:             Awake, Alert, Oriented x 3  Other:  No focal tenderness at the calf thigh or upper extremity No rash No wounds noted on the R knee   ED Results / Procedures / Treatments  Labs (all labs ordered are listed, but only abnormal results are displayed) Labs Reviewed  POC URINE PREG, ED      EKG     RADIOLOGY    PROCEDURES:  Critical Care performed: No  Procedures  MEDICATIONS ORDERED IN ED: Medications  doxycycline (VIBRA-TABS) tablet 200 mg (200 mg Oral Given 10/05/22 1422)  ibuprofen (ADVIL) tablet 400 mg (400 mg Oral Given 10/05/22 1422)     IMPRESSION / MDM / ASSESSMENT AND PLAN / ED COURSE  I reviewed the triage vital signs and the nursing notes.                              Patient's presentation is most consistent with acute, uncomplicated illness.  Differential diagnosis includes, but is not limited to, Lyme's exposure, Lyme's disease less likely,  Anxiety The patient is a 37 year old female presents after tick exposure.  She pulled a tick off her right knee yesterday burned it afterward  and does come with it and a piece of aluminum foil.  Since that time she has had pain on the whole right side of her body from the side of the body as well as evidence of vomiting headache is concerned about Lyme's disease.  Patient cannot tell me whether the tick was engorged or for how long it was on for.  Denies any rash.  On exam she does look somewhat anxious I do not appreciate any rash specifically no EM rash.  There is no obvious wound on the knee at the site of the tick bite.  Her compartments are soft and she has no focal tenderness of the right leg and right arm.  Otherwise she looks well.  While I suppose that if the tick had been on for at least 3 days she could have contracted Lyme's disease this would be unlikely especially as her presenting symptoms of right sided pain related to the tick bite is not consistent with Lyme's disease.  Additionally we are not in a Lyme endemic area.  Patient is quite concerned about Lyme disease so I did offer her the one-time dose of doxycycline for prophylaxis.  Did obtain pregnancy test prior to given the doxycycline and this is negative.  Recommended that if she continues to have symptoms in several weeks that she  follow-up with her primary doctor.   Clinical Course as of 10/05/22 1517  Wed Oct 05, 2022  1414 Preg Test, Ur: NEGATIVE [KM]    Clinical Course User Index [KM] Georga Hacking, MD     FINAL CLINICAL IMPRESSION(S) / ED DIAGNOSES   Final diagnoses:  Tick bite of right knee, initial encounter     Rx / DC Orders   ED Discharge Orders     None        Note:  This document was prepared using Dragon voice recognition software and may include unintentional dictation errors.   Georga Hacking, MD 10/05/22 [phone]

## 2022-10-05 NOTE — Discharge Instructions (Addendum)
You were given a dose of doxycycline to prevent against Lyme's disease.  If in several weeks you are still having symptoms that are concerning to you then please follow-up with your primary doctor.

## 2022-10-07 ENCOUNTER — Telehealth: Payer: Self-pay

## 2022-10-07 NOTE — Transitions of Care (Post Inpatient/ED Visit) (Signed)
   10/07/2022  Name: DAYLAH SAYAVONG MRN: [id number] DOB: 04-18-1986  Today's TOC FU Call Status: TOC FU Call Complete Date: 10/07/22  Transition Care Management Follow-up Telephone Call Date of Discharge: 10/05/22 Discharge Facility: [facility]) Type of Discharge: Emergency Department Reason for ED Visit: Other: (Tick Bite) How have you been since you were released from the hospital?: Better Any questions or concerns?: No  Items Reviewed: Did you receive and understand the discharge instructions provided?: Yes Medications obtained and verified?: Yes (Medications Reviewed) Any new allergies since your discharge?: No Dietary orders reviewed?: NA Do you have support at home?: No  Home Care and Equipment/Supplies: Were Home Health Services Ordered?: NA Any new equipment or medical supplies ordered?: NA  Functional Questionnaire: Do you need assistance with bathing/showering or dressing?: No Do you need assistance with meal preparation?: No Do you need assistance with eating?: No Do you have difficulty maintaining continence: No Do you need assistance with getting out of bed/getting out of a chair/moving?: No Do you have difficulty managing or taking your medications?: No  Follow up appointments reviewed: PCP Follow-up appointment confirmed?: No (Patient states she was let go from her job today so she currently does not have insurance to come in.) MD Provider Line Number:[phone] Given: No Specialist Hospital Follow-up appointment confirmed?: NA Do you need transportation to your follow-up appointment?: No Do you understand care options if your condition(s) worsen?: Yes-patient verbalized understanding    SIGNATURE: Wilhemena Durie, CMA

## 2023-01-31 ENCOUNTER — Ambulatory Visit: Payer: Self-pay

## 2023-04-07 ENCOUNTER — Encounter: Payer: 59 | Admitting: Nurse Practitioner

## 2023-04-07 NOTE — Progress Notes (Deleted)
There were no vitals taken for this visit.   Subjective:    Patient ID: Haley Thomas, female    DOB: September 03, 1985, 37 y.o.   MRN: [id number]  HPI: Haley Thomas is a 37 y.o. female presenting on 04/07/2023 for comprehensive medical examination. Current medical complaints include: recently had melanoma removed  She currently lives with: Menopausal Symptoms: no   Denies HA, CP, SOB, dizziness, palpitations, visual changes, and lower extremity swelling.   MOOD Feels like she is doing well without medication.  Mood has been up and down.  Denies SI.   Depression Screen done today and results listed below:     04/05/2022    9:32 AM 02/02/2022    9:47 AM 06/25/2019    4:22 PM  Depression screen PHQ 2/9  Decreased Interest 0 0 0  Down, Depressed, Hopeless 0 1 0  PHQ - 2 Score 0 1 0  Altered sleeping 0 3   Tired, decreased energy 0 3   Change in appetite 0 2   Feeling bad or failure about yourself  0 0   Trouble concentrating 0 0   Moving slowly or fidgety/restless 0 0   Suicidal thoughts 0 0   PHQ-9 Score 0 9   Difficult doing work/chores Not difficult at all Not difficult at all     The patient does not have a history of falls. I did complete a risk assessment for falls. A plan of care for falls was documented.   Past Medical History:  Past Medical History:  Diagnosis Date   Patient denies medical problems     Surgical History:  Past Surgical History:  Procedure Laterality Date   APPENDECTOMY      Medications:  Current Outpatient Medications on File Prior to Visit  Medication Sig   JUNEL FE 1/20 1-20 MG-MCG tablet Take 1 tablet by mouth daily.   No current facility-administered medications on file prior to visit.    Allergies:  No Known Allergies  Social History:  Social History   Socioeconomic History   Marital status: Single    Spouse name: Not on file   Number of children: Not on file   Years of education: Not on file   Highest education  level: Not on file  Occupational History   Not on file  Tobacco Use   Smoking status: Every Day    Current packs/day: 0.50    Types: Cigarettes   Smokeless tobacco: Never  Vaping Use   Vaping status: Some Days  Substance and Sexual Activity   Alcohol use: Yes    Comment: occassional    Drug use: No   Sexual activity: Yes    Birth control/protection: Condom, Pill  Other Topics Concern   Not on file  Social History Narrative   Not on file   Social Determinants of Health   Financial Resource Strain: Not on file  Food Insecurity: Not on file  Transportation Needs: Not on file  Physical Activity: Not on file  Stress: Not on file  Social Connections: Not on file  Intimate Partner Violence: Not on file   Social History   Tobacco Use  Smoking Status Every Day   Current packs/day: 0.50   Types: Cigarettes  Smokeless Tobacco Never   Social History   Substance and Sexual Activity  Alcohol Use Yes   Comment: occassional     Family History:  Family History  Problem Relation Age of Onset   Heart attack Mother  Hypertension Mother    Lung disease Mother    COPD Mother    Hypertension Father     Past medical history, surgical history, medications, allergies, family history and social history reviewed with patient today and changes made to appropriate areas of the chart.   Review of Systems  Eyes:  Negative for blurred vision and double vision.  Respiratory:  Negative for shortness of breath.   Cardiovascular:  Negative for chest pain, palpitations and leg swelling.  Skin:        Stiches on chest from melanoma removal  Neurological:  Negative for dizziness and headaches.   All other ROS negative except what is listed above and in the HPI.      Objective:    There were no vitals taken for this visit.  Wt Readings from Last 3 Encounters:  04/05/22 104 lb 6.4 oz (47.4 kg)  02/02/22 103 lb (46.7 kg)  06/25/19 107 lb (48.5 kg)    Physical Exam Vitals and  nursing note reviewed. Exam conducted with a chaperone present [facility] [place], [place]).  Constitutional:      General: She is awake. She is not in acute distress.    Appearance: She is well-developed. She is not ill-appearing.  HENT:     Head: Normocephalic and atraumatic.     Right Ear: Hearing, tympanic membrane, ear canal and external ear normal. No drainage.     Left Ear: Hearing, tympanic membrane, ear canal and external ear normal. No drainage.     Nose: Nose normal.     Right Sinus: No maxillary sinus tenderness or frontal sinus tenderness.     Left Sinus: No maxillary sinus tenderness or frontal sinus tenderness.     Mouth/Throat:     Mouth: Mucous membranes are moist.     Pharynx: Oropharynx is clear. Uvula midline. No pharyngeal swelling, oropharyngeal exudate or posterior oropharyngeal erythema.  Eyes:     General: Lids are normal.        Right eye: No discharge.        Left eye: No discharge.     Extraocular Movements: Extraocular movements intact.     Conjunctiva/sclera: Conjunctivae normal.     Pupils: Pupils are equal, round, and reactive to light.     Visual Fields: Right eye visual fields normal and left eye visual fields normal.  Neck:     Thyroid: No thyromegaly.     Vascular: No carotid bruit.     Trachea: Trachea normal.  Cardiovascular:     Rate and Rhythm: Normal rate and regular rhythm.     Heart sounds: Normal heart sounds. No murmur heard.    No gallop.  Pulmonary:     Effort: Pulmonary effort is normal. No accessory muscle usage or respiratory distress.     Breath sounds: Normal breath sounds.  Chest:  Breasts:    Right: Normal.     Left: Normal.  Abdominal:     General: Bowel sounds are normal.     Palpations: Abdomen is soft. There is no hepatomegaly or splenomegaly.     Tenderness: There is no abdominal tenderness.  Genitourinary:    General: Normal vulva.     Exam position: Knee-chest position.     Vagina: Normal.     Cervix: Normal.      Adnexa: Right adnexa normal and left adnexa normal.  Musculoskeletal:        General: Normal range of motion.     Cervical back: Normal range of motion and  neck supple.     Right lower leg: No edema.     Left lower leg: No edema.  Lymphadenopathy:     Head:     Right side of head: No submental, submandibular, tonsillar, preauricular or posterior auricular adenopathy.     Left side of head: No submental, submandibular, tonsillar, preauricular or posterior auricular adenopathy.     Cervical: No cervical adenopathy.     Upper Body:     Right upper body: No supraclavicular, axillary or pectoral adenopathy.     Left upper body: No supraclavicular, axillary or pectoral adenopathy.  Skin:    General: Skin is warm and dry.     Capillary Refill: Capillary refill takes less than 2 seconds.     Findings: No rash.       Neurological:     Mental Status: She is alert and oriented to person, place, and time.     Gait: Gait is intact.     Deep Tendon Reflexes: Reflexes are normal and symmetric.     Reflex Scores:      Brachioradialis reflexes are 2+ on the right side and 2+ on the left side.      Patellar reflexes are 2+ on the right side and 2+ on the left side. Psychiatric:        Attention and Perception: Attention normal.        Mood and Affect: Mood normal.        Speech: Speech normal.        Behavior: Behavior normal. Behavior is cooperative.        Thought Content: Thought content normal.        Judgment: Judgment normal.     Results for orders placed or performed during the hospital encounter of 10/05/22  POC Urine Pregnancy, ED  Result Value Ref Range   Preg Test, Ur NEGATIVE NEGATIVE      Assessment & Plan:   Problem List Items Addressed This Visit   None     Follow up plan: No follow-ups on file.   LABORATORY TESTING:  - Pap smear: pap done  IMMUNIZATIONS:   - Tdap: Tetanus vaccination status reviewed: Refused. - Influenza: Refused - Pneumovax: Refused -  Prevnar: Not applicable - COVID: Not applicable - HPV: Not applicable - Shingrix vaccine: Not applicable  SCREENING: -Mammogram: Not applicable  - Colonoscopy: Not applicable  - Bone Density: Not applicable  -Hearing Test: Not applicable  -Spirometry: Not applicable   PATIENT COUNSELING:   Advised to take 1 mg of folate supplement per day if capable of pregnancy.   Sexuality: Discussed sexually transmitted diseases, partner selection, use of condoms, avoidance of unintended pregnancy  and contraceptive alternatives.   Advised to avoid cigarette smoking.  I discussed with the patient that most people either abstain from alcohol or drink within safe limits (<=14/week and <=4 drinks/occasion for males, <=7/weeks and <= 3 drinks/occasion for females) and that the risk for alcohol disorders and other health effects rises proportionally with the number of drinks per week and how often a drinker exceeds daily limits.  Discussed cessation/primary prevention of drug use and availability of treatment for abuse.   Diet: Encouraged to adjust caloric intake to maintain  or achieve ideal body weight, to reduce intake of dietary saturated fat and total fat, to limit sodium intake by avoiding high sodium foods and not adding table salt, and to maintain adequate dietary potassium and calcium preferably from fresh fruits, vegetables, and low-fat dairy products.  stressed the importance of regular exercise  Injury prevention: Discussed safety belts, safety helmets, smoke detector, smoking near bedding or upholstery.   Dental health: Discussed importance of regular tooth brushing, flossing, and dental visits.    NEXT PREVENTATIVE PHYSICAL DUE IN 1 YEAR. No follow-ups on file.

## 2023-06-09 ENCOUNTER — Telehealth: Payer: Self-pay

## 2023-06-09 NOTE — Transitions of Care (Post Inpatient/ED Visit) (Signed)
   06/09/2023  Name: LOHGAN FALETTI MRN: [id number] DOB: Jan 14, 1986  Today's TOC FU Call Status: Today's TOC FU Call Status:: Unsuccessful Call (1st Attempt) Unsuccessful Call (1st Attempt) Date: 06/09/23  Attempted to reach the patient regarding the most recent Inpatient/ED visit.  Follow Up Plan: Additional outreach attempts will be made to reach the patient to complete the Transitions of Care (Post Inpatient/ED visit) call.

## 2023-12-18 ENCOUNTER — Ambulatory Visit: Admission: EM | Admit: 2023-12-18 | Discharge: 2023-12-18 | Payer: Self-pay

## 2023-12-18 DIAGNOSIS — S0990XA Unspecified injury of head, initial encounter: Secondary | ICD-10-CM

## 2023-12-18 DIAGNOSIS — R0781 Pleurodynia: Secondary | ICD-10-CM
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
## 2024-02-28 ENCOUNTER — Ambulatory Visit
Admission: EM | Admit: 2024-02-28 | Discharge: 2024-02-28 | Disposition: A | Discharge status: Home / Self Care | Payer: Self-pay | Attending: Physician Assistant | Admitting: Physician Assistant

## 2024-02-28 ENCOUNTER — Encounter: Payer: Self-pay | Admitting: Emergency Medicine

## 2024-02-28 DIAGNOSIS — Z3202 Encounter for pregnancy test, result negative: Secondary | ICD-10-CM | POA: Insufficient documentation

## 2024-02-28 DIAGNOSIS — R112 Nausea with vomiting, unspecified: Secondary | ICD-10-CM | POA: Insufficient documentation

## 2024-02-28 DIAGNOSIS — R197 Diarrhea, unspecified: Secondary | ICD-10-CM | POA: Insufficient documentation

## 2024-02-28 LAB — PREGNANCY, URINE: Preg Test, Ur: NEGATIVE

## 2024-02-28 LAB — URINALYSIS, W/ REFLEX TO CULTURE (INFECTION SUSPECTED)
Bilirubin Urine: NEGATIVE
Glucose, UA: NEGATIVE mg/dL
Hgb urine dipstick: NEGATIVE
Ketones, ur: NEGATIVE mg/dL
Leukocytes,Ua: NEGATIVE
Nitrite: NEGATIVE
Protein, ur: NEGATIVE mg/dL
RBC / HPF: NONE SEEN RBC/hpf (ref 0–5)
Specific Gravity, Urine: <1.005 — ABNORMAL LOW (ref 1.005–1.030)
WBC, UA: NONE SEEN WBC/hpf (ref 0–5)
pH: 5.5 (ref 5.0–8.0)

## 2024-02-28 MED ORDER — ONDANSETRON 4 MG PO TBDP: 4.0000 mg | ORAL_TABLET | Freq: Three times a day (TID) | ORAL | 0 refills | Status: AC | PRN

## 2024-02-28 NOTE — Discharge Instructions (Signed)
 NAUSEA/VOMITING/DIARRHEA: You may take Tylenol  for pain relief. Use medications as directed including antiemetics and antidiarrheal medications if suggested or prescribed. You should increase fluids and electrolytes as well as rest over these next several days. If you have any questions or concerns, or if your symptoms are not improving or if especially if they acutely worsen, please call or stop back to the clinic immediately and we will be happy to help you or go to the ER   RED FLAGS: Seek immediate further care if: symptoms remain the same or worsen over the next 3-7 days, you are unable to keep fluids down, you see blood or mucus in your stool, you vomit black or dark red material, you have a fever of 101.F or higher, you have localized and/or persistent abdominal pain

## 2024-02-28 NOTE — ED Provider Notes (Signed)
 [facility]    CSN: [id number] Arrival date & time: 02/28/24  1458      History   Chief Complaint Chief Complaint  Patient presents with   Emesis   Diarrhea    HPI Haley Thomas is a 38 y.o. female presenting for 4-day history of nausea/vomiting and diarrhea.  Denies fever, cough, congestion, sore throat, abdominal pain, dysuria, vaginal discharge.  Reports urinary frequency but says she has been drinking a lot of fluids.  She states she is supposed to start her menstrual period today.  Reports concern for possible pregnancy.  She is not on any birth control and does not use protection.  She has taken pregnancy tests at home which have been negative.  States her son is currently sick with vomiting and diarrhea as well.  No OTC meds taken.  HPI  Past Medical History:  Diagnosis Date   Patient denies medical problems     Patient Active Problem List   Diagnosis Date Noted   Positive depression screening 02/02/2022   Smoker 05/23/2016   Cervical dysplasia 01/06/2016    Past Surgical History:  Procedure Laterality Date   APPENDECTOMY      OB History     Gravida  1   Para  1   Term  1   Preterm  0   AB  0   Living  1      SAB  0   IAB  0   Ectopic  0   Multiple  0   Live Births  1            Home Medications    Prior to Admission medications   Medication Sig Start Date End Date Taking? Authorizing Provider  ondansetron  (ZOFRAN -ODT) 4 MG disintegrating tablet Take 1 tablet (4 mg total) by mouth every 8 (eight) hours as needed. 02/28/24  Yes Arvis Jolan NOVAK, PA-C    Family History Family History  Problem Relation Age of Onset   Heart attack Mother    Hypertension Mother    Lung disease Mother    COPD Mother    Hypertension Father     Social History Social History   Tobacco Use   Smoking status: Every Day    Current packs/day: 0.50    Types: Cigarettes   Smokeless tobacco: Never  Vaping Use   Vaping status: Some  Days  Substance Use Topics   Alcohol use: Yes    Comment: occassional    Drug use: No     Allergies   Patient has no known allergies.   Review of Systems Review of Systems  Constitutional:  Positive for appetite change and fatigue. Negative for chills, diaphoresis and fever.  HENT:  Negative for congestion, ear pain, rhinorrhea, sinus pressure, sinus pain and sore throat.   Respiratory:  Negative for cough and shortness of breath.   Cardiovascular:  Negative for chest pain.  Gastrointestinal:  Positive for diarrhea, nausea and vomiting. Negative for abdominal pain.  Genitourinary:  Positive for frequency. Negative for difficulty urinating and dysuria.  Musculoskeletal:  Negative for arthralgias and myalgias.  Skin:  Negative for rash.  Neurological:  Negative for weakness and headaches.  Hematological:  Negative for adenopathy.     Physical Exam Triage Vital Signs ED Triage Vitals  Encounter Vitals Group     BP 02/28/24 1546 98/64     Girls Systolic BP Percentile --      Girls Diastolic BP Percentile --  Boys Systolic BP Percentile --      Boys Diastolic BP Percentile --      Pulse Rate 02/28/24 1546 (!) 101     Resp 02/28/24 1546 18     Temp 02/28/24 1546 98.3 F (36.8 C)     Temp Source 02/28/24 1546 Oral     SpO2 02/28/24 1546 100 %     Weight --      Height --      Head Circumference --      Peak Flow --      Pain Score 02/28/24 1545 0     Pain Loc --      Pain Education --      Exclude from Growth Chart --    No data found.  Updated Vital Signs BP 98/64 (BP Location: Left Arm)   Pulse (!) 101   Temp 98.3 F (36.8 C) (Oral)   Resp 18   LMP  (LMP Unknown)   SpO2 100%     Physical Exam Vitals and nursing note reviewed.  Constitutional:      General: She is not in acute distress.    Appearance: Normal appearance. She is not ill-appearing or toxic-appearing.  HENT:     Head: Normocephalic and atraumatic.     Nose: Nose normal.      Mouth/Throat:     Mouth: Mucous membranes are moist.     Pharynx: Oropharynx is clear.  Eyes:     General: No scleral icterus.       Right eye: No discharge.        Left eye: No discharge.     Conjunctiva/sclera: Conjunctivae normal.  Cardiovascular:     Rate and Rhythm: Regular rhythm. Tachycardia present.     Heart sounds: Normal heart sounds.  Pulmonary:     Effort: Pulmonary effort is normal. No respiratory distress.     Breath sounds: Normal breath sounds.  Abdominal:     Palpations: Abdomen is soft.     Tenderness: There is no abdominal tenderness. There is no right CVA tenderness, left CVA tenderness, guarding or rebound.  Musculoskeletal:     Cervical back: Neck supple.  Skin:    General: Skin is dry.  Neurological:     General: No focal deficit present.     Mental Status: She is alert. Mental status is at baseline.     Motor: No weakness.     Gait: Gait normal.  Psychiatric:        Mood and Affect: Mood normal.      UC Treatments / Results  Labs (all labs ordered are listed, but only abnormal results are displayed) Labs Reviewed  URINALYSIS, W/ REFLEX TO CULTURE (INFECTION SUSPECTED) - Abnormal; Notable for the following components:      Result Value   Color, Urine STRAW (*)    Specific Gravity, Urine <1.005 (*)    Bacteria, UA RARE (*)    All other components within normal limits  PREGNANCY, URINE    EKG   Radiology No results found.  Procedures Procedures (including critical care time)  Medications Ordered in UC Medications - No data to display  Initial Impression / Assessment and Plan / UC Course  I have reviewed the triage vital signs and the nursing notes.  Pertinent labs & imaging results that were available during my care of the patient were reviewed by me and considered in my medical decision making (see chart for details).   38 year old female presents for nausea/vomiting  and diarrhea for the past few days.  Son is sick as well.  She  states she has not started her menstrual period yet.  States her app told her she is supposed to start her menstrual period today.  She is concerned for pregnancy even though she has had negative pregnancy test at home.  She is not on any birth control.  She is afebrile.  Overall well-appearing.  No acute distress.  Abdomen soft and nontender.  Urine pregnancy negative. Urinalysis performed due to complaint of urinary frequency. Negative for infection.   Viral gastroenteritis.  Supportive care encouraged increased rest and fluids.  Sent Zofran  to pharmacy. Reviewed return precautions.   Final Clinical Impressions(s) / UC Diagnoses   Final diagnoses:  Nausea vomiting and diarrhea  Negative pregnancy test     Discharge Instructions      NAUSEA/VOMITING/DIARRHEA: You may take Tylenol  for pain relief. Use medications as directed including antiemetics and antidiarrheal medications if suggested or prescribed. You should increase fluids and electrolytes as well as rest over these next several days. If you have any questions or concerns, or if your symptoms are not improving or if especially if they acutely worsen, please call or stop back to the clinic immediately and we will be happy to help you or go to the ER   RED FLAGS: Seek immediate further care if: symptoms remain the same or worsen over the next 3-7 days, you are unable to keep fluids down, you see blood or mucus in your stool, you vomit black or dark red material, you have a fever of 101.F or higher, you have localized and/or persistent abdominal pain       ED Prescriptions     Medication Sig Dispense Auth. Provider   ondansetron  (ZOFRAN -ODT) 4 MG disintegrating tablet Take 1 tablet (4 mg total) by mouth every 8 (eight) hours as needed. 15 tablet Nathin Saran B, PA-C      PDMP not reviewed this encounter.   Arvis Jolan NOVAK, PA-C 02/28/24 1621

## 2024-02-28 NOTE — ED Triage Notes (Signed)
 Pt presents with vomiting and diarrhea x 4 days. Pt states she vomits 2-3 times per day. She also reports that she has missed her period. She took 2 pregnancy test and they were negative.
